# Patient Record
Sex: Male | Born: 1960 | Race: White | Hispanic: No | Marital: Married | Smoking: Current every day smoker
Health system: Southern US, Community
[De-identification: ages and names within clinical notes are randomized; demographics above are authoritative.]

## PROBLEM LIST (undated history)

## (undated) DIAGNOSIS — R55 Syncope and collapse: Secondary | ICD-10-CM

## (undated) HISTORY — DX: Syncope and collapse: R55

---

## 2004-09-13 ENCOUNTER — Encounter: Admission: RE | Admit: 2004-09-13 | Discharge: 2004-09-13 | Payer: Self-pay | Admitting: Internal Medicine

## 2012-03-06 ENCOUNTER — Other Ambulatory Visit: Payer: Self-pay | Admitting: Internal Medicine

## 2012-03-06 DIAGNOSIS — R55 Syncope and collapse: Secondary | ICD-10-CM

## 2012-03-07 ENCOUNTER — Ambulatory Visit
Admission: RE | Admit: 2012-03-07 | Discharge: 2012-03-07 | Disposition: A | Discharge status: Home / Self Care | Payer: BC Managed Care – PPO | Source: Ambulatory Visit | Attending: Internal Medicine | Admitting: Internal Medicine

## 2012-03-07 DIAGNOSIS — R55 Syncope and collapse: Secondary | ICD-10-CM

## 2012-04-04 ENCOUNTER — Other Ambulatory Visit (HOSPITAL_COMMUNITY): Payer: Self-pay | Admitting: Cardiovascular Disease

## 2012-04-05 ENCOUNTER — Other Ambulatory Visit (HOSPITAL_COMMUNITY): Payer: Self-pay | Admitting: Cardiovascular Disease

## 2012-04-05 DIAGNOSIS — R011 Cardiac murmur, unspecified: Secondary | ICD-10-CM

## 2012-04-05 DIAGNOSIS — R079 Chest pain, unspecified: Secondary | ICD-10-CM

## 2012-04-05 DIAGNOSIS — R55 Syncope and collapse: Secondary | ICD-10-CM

## 2012-04-16 ENCOUNTER — Ambulatory Visit (HOSPITAL_COMMUNITY)
Admission: RE | Admit: 2012-04-16 | Discharge: 2012-04-16 | Disposition: A | Discharge status: Home / Self Care | Payer: BC Managed Care – PPO | Source: Ambulatory Visit | Attending: Cardiovascular Disease | Admitting: Cardiovascular Disease

## 2012-04-16 DIAGNOSIS — I079 Rheumatic tricuspid valve disease, unspecified: Secondary | ICD-10-CM | POA: Insufficient documentation

## 2012-04-16 DIAGNOSIS — R55 Syncope and collapse: Secondary | ICD-10-CM | POA: Insufficient documentation

## 2012-04-16 DIAGNOSIS — R011 Cardiac murmur, unspecified: Secondary | ICD-10-CM | POA: Insufficient documentation

## 2012-04-16 DIAGNOSIS — F17200 Nicotine dependence, unspecified, uncomplicated: Secondary | ICD-10-CM | POA: Insufficient documentation

## 2012-04-16 NOTE — Progress Notes (Signed)
[facility]   2D echo completed 04/16/2012.   Cindy Kasee Hantz, RDCS   

## 2012-04-18 ENCOUNTER — Ambulatory Visit (HOSPITAL_COMMUNITY)
Admission: RE | Admit: 2012-04-18 | Discharge: 2012-04-18 | Disposition: A | Discharge status: Home / Self Care | Payer: BC Managed Care – PPO | Source: Ambulatory Visit | Attending: Cardiovascular Disease | Admitting: Cardiovascular Disease

## 2012-04-18 DIAGNOSIS — J449 Chronic obstructive pulmonary disease, unspecified: Secondary | ICD-10-CM | POA: Insufficient documentation

## 2012-04-18 DIAGNOSIS — R079 Chest pain, unspecified: Secondary | ICD-10-CM | POA: Insufficient documentation

## 2012-04-18 DIAGNOSIS — R55 Syncope and collapse: Secondary | ICD-10-CM | POA: Insufficient documentation

## 2012-04-18 DIAGNOSIS — J4489 Other specified chronic obstructive pulmonary disease: Secondary | ICD-10-CM | POA: Insufficient documentation

## 2012-04-18 DIAGNOSIS — Z8249 Family history of ischemic heart disease and other diseases of the circulatory system: Secondary | ICD-10-CM | POA: Insufficient documentation

## 2012-04-18 DIAGNOSIS — F17200 Nicotine dependence, unspecified, uncomplicated: Secondary | ICD-10-CM | POA: Insufficient documentation

## 2012-04-18 MED ORDER — TECHNETIUM TC 99M SESTAMIBI GENERIC - CARDIOLITE
10.4000 | Freq: Once | INTRAVENOUS | Status: AC | PRN
  Administered 2012-04-18: 10 via INTRAVENOUS

## 2012-04-18 MED ORDER — TECHNETIUM TC 99M SESTAMIBI GENERIC - CARDIOLITE
31.2000 | Freq: Once | INTRAVENOUS | Status: AC | PRN
  Administered 2012-04-18: 31.2 via INTRAVENOUS

## 2012-04-18 NOTE — Procedures (Addendum)
[facility] [address] [place] [address] [phone]  Cardiology Nuclear Med Study  Jared Hansen is a 51 y.o. male     MRN : [id number]     DOB: 09/01/1960  Procedure Date: 04/18/2012  Nuclear Med Background Indication for Stress Test:  Evaluation for Ischemia History:  COPD and No prior cardiac history Cardiac Risk Factors: Family History - CAD and Smoker  Symptoms:  Syncope   Nuclear Pre-Procedure Caffeine/Decaff Intake:  10:00pm NPO After: 7:30am   IV Site: R Antecubital  IV 0.9% NS with Angio Cath:  22g  Chest Size (in):  42 IV Started by: [name], CNMT  Height: 5\' 8"  (1.727 m)  Cup Size: n/a  BMI:  Body mass index is 30.26 kg/(m^2). Weight:  199 lb (90.266 kg)   Tech Comments:  n/a    Nuclear Med Study 1 or 2 day study: 1 day  Stress Test Type:  Stress  Order Authorizing Provider:  Hazeline Junker   Resting Radionuclide: Technetium 36m Sestamibi  Resting Radionuclide Dose: 10.4 mCi   Stress Radionuclide:  Technetium 27m Sestamibi  Stress Radionuclide Dose: 31.2 mCi           Stress Protocol Rest HR: 76 Stress HR:  173  Rest BP: 121/84 Stress BP: 183/78  Exercise Time (min): 08:30 METS: 10.1   Predicted Max HR: 169 bpm % Max HR: 102.37 bpm Rate Pressure Product: 21308   Dose of Adenosine (mg):  n/a Dose of Lexiscan: n/a mg  Dose of Atropine (mg): n/a Dose of Dobutamine: n/a mcg/kg/min (at max HR)  Stress Test Technologist: Esperanza Sheets, CCT Nuclear Technologist: Gonzella Lex, CNMT   Rest Procedure:  Myocardial perfusion imaging was performed at rest 45 minutes following the intravenous administration of Technetium 61m Sestamibi. Stress Procedure:  The patient performed treadmill exercise using a Bruce  Protocol for 8:30 minutes. The patient stopped due to SOB and denied any chest pain.  There were no significant ST-T wave changes.  Technetium 24m Sestamibi was injected at peak  exercise and myocardial perfusion imaging was performed after a brief delay.  Transient Ischemic Dilatation (Normal <1.22):  0.59 Lung/Heart Ratio (Normal <0.45):  0.32 QGS EDV:  71 ml QGS ESV:  20 ml LV Ejection Fraction: 72%  Rest ECG: NSR - Normal EKG  Stress ECG: Sinus tachycardia, no ischemic ST/T changes noted with exercise  QPS Raw Data Images:  Mild diaphragmatic attenuation.  Normal left ventricular size. Stress Images:  Mild inferior and septal defect. Rest Images:  Mild inferior defect with bowel attenuation Subtraction (SDS):  3  Impression Exercise Capacity:  Good exercise capacity. BP Response:  Normal blood pressure response. Clinical Symptoms:  Mild chest pain/dyspnea. ECG Impression:  No significant ST segment change suggestive of ischemia. Comparison with Prior Nuclear Study: No previous nuclear study performed  Overall Impression:  Low risk stress nuclear study. and Abnormal stress nuclear study. There is no significant reversible ischemia.  LV Wall Motion:  NL LV Function; NL Wall Motion  Chrystie Nose, MD, [facility] Attending Cardiologist [facility]  Chrystie Nose, MD  04/18/2012 5:23 PM

## 2012-09-05 ENCOUNTER — Encounter: Payer: Self-pay | Admitting: Cardiovascular Disease

## 2014-12-31 ENCOUNTER — Encounter: Payer: Self-pay | Admitting: *Deleted

## 2015-01-21 ENCOUNTER — Encounter: Payer: Self-pay | Admitting: Cardiovascular Disease

## 2016-03-13 DIAGNOSIS — M47816 Spondylosis without myelopathy or radiculopathy, lumbar region: Secondary | ICD-10-CM | POA: Diagnosis not present

## 2016-03-13 DIAGNOSIS — M9903 Segmental and somatic dysfunction of lumbar region: Secondary | ICD-10-CM | POA: Diagnosis not present

## 2016-06-28 DIAGNOSIS — L570 Actinic keratosis: Secondary | ICD-10-CM | POA: Diagnosis not present

## 2016-06-28 DIAGNOSIS — Z85828 Personal history of other malignant neoplasm of skin: Secondary | ICD-10-CM | POA: Diagnosis not present

## 2016-06-28 DIAGNOSIS — L821 Other seborrheic keratosis: Secondary | ICD-10-CM | POA: Diagnosis not present

## 2016-08-25 DIAGNOSIS — Z0000 Encounter for general adult medical examination without abnormal findings: Secondary | ICD-10-CM | POA: Diagnosis not present

## 2016-08-25 DIAGNOSIS — Z125 Encounter for screening for malignant neoplasm of prostate: Secondary | ICD-10-CM | POA: Diagnosis not present

## 2016-08-30 DIAGNOSIS — E7800 Pure hypercholesterolemia, unspecified: Secondary | ICD-10-CM | POA: Diagnosis not present

## 2016-08-30 DIAGNOSIS — M722 Plantar fascial fibromatosis: Secondary | ICD-10-CM | POA: Diagnosis not present

## 2016-08-30 DIAGNOSIS — M545 Low back pain: Secondary | ICD-10-CM | POA: Diagnosis not present

## 2016-08-30 DIAGNOSIS — Z0001 Encounter for general adult medical examination with abnormal findings: Secondary | ICD-10-CM | POA: Diagnosis not present

## 2016-08-30 DIAGNOSIS — Z1212 Encounter for screening for malignant neoplasm of rectum: Secondary | ICD-10-CM | POA: Diagnosis not present

## 2016-09-07 ENCOUNTER — Other Ambulatory Visit: Payer: Self-pay | Admitting: Acute Care

## 2016-09-07 DIAGNOSIS — F17210 Nicotine dependence, cigarettes, uncomplicated: Secondary | ICD-10-CM

## 2016-09-14 ENCOUNTER — Telehealth: Payer: Self-pay | Admitting: Acute Care

## 2016-09-14 NOTE — Telephone Encounter (Signed)
I called Arena at [facility] to let her know I had actually cancelled this appointment for the CT as well by mistake and that we would be calling to reschedule this once the patient's appointment is rescheduled.

## 2016-09-18 ENCOUNTER — Ambulatory Visit: Payer: Self-pay

## 2016-09-18 ENCOUNTER — Encounter: Payer: Self-pay | Admitting: Acute Care

## 2016-09-18 NOTE — Telephone Encounter (Signed)
LMTC x 1  

## 2016-09-26 NOTE — Telephone Encounter (Signed)
Spoke with pt.  Per [employer] he will need to contact Dr Carolee RotaPharr's office to have a medical review to [facility]  Pt will call back once he gets a response.  Will await call

## 2016-10-10 NOTE — Telephone Encounter (Signed)
Will route this call back to the lung nodule pool to follow up on SDMV.

## 2016-10-12 NOTE — Telephone Encounter (Signed)
LMTC x 1  

## 2016-10-17 NOTE — Telephone Encounter (Signed)
Angelique BlonderDenise, have you heard from the patient?

## 2016-10-19 NOTE — Telephone Encounter (Signed)
Jared Hansen any updates on the message?  thanks

## 2016-10-19 NOTE — Telephone Encounter (Signed)
Spoke with pt.  He states that he talked to Dr Carolee RotaPharr's office yesterday and they are waiting to hear from [facility] regarding medical release of info for lung screening review.  I informed him of the option of doing scan [facility] for an out of pocket charge.  Pt wants to wait and see what he can find out through Dr Carolee RotaPharr's office. I advised pt that I will call him back in a week to see if there has been any progress.  I will close this message and refer to referral notes.

## 2016-10-23 DIAGNOSIS — R197 Diarrhea, unspecified: Secondary | ICD-10-CM | POA: Diagnosis not present

## 2016-10-23 DIAGNOSIS — Z79899 Other long term (current) drug therapy: Secondary | ICD-10-CM | POA: Diagnosis not present

## 2016-10-24 ENCOUNTER — Telehealth: Payer: Self-pay | Admitting: Acute Care

## 2016-10-25 NOTE — Telephone Encounter (Signed)
Will route to Lung Nodule Pool

## 2016-10-26 NOTE — Telephone Encounter (Signed)
Spoke with Jared Hansen at Dr Carolee RotaPharr's office and she states that they have not heard anything from [facility] regarding ling screening. I spoke with Jared Hansen and made him aware that Dr Carolee RotaPharr's office has not heard from [facility].  I explained to him again that [employer] does not cover the lung screening and that it would be [facility] and [facility].  Jared Hansen asked that I call him back in a week so he can think about it.  Will defer until that time.  Will close this message and refer to referral notes.

## 2017-01-16 ENCOUNTER — Telehealth: Payer: Self-pay | Admitting: Acute Care

## 2017-01-16 NOTE — Telephone Encounter (Signed)
LMTC x 1  

## 2017-01-16 NOTE — Telephone Encounter (Signed)
Pat with Dr. Carolee RotaPharr's office returned call, CB [phone].

## 2017-01-16 NOTE — Telephone Encounter (Signed)
Will forward to the lung nodule pool 

## 2017-01-16 NOTE — Telephone Encounter (Signed)
Jared Hansen is faxing referral over.

## 2017-01-19 NOTE — Telephone Encounter (Signed)
Referral received.  Will close this message.

## 2017-03-19 DIAGNOSIS — M9903 Segmental and somatic dysfunction of lumbar region: Secondary | ICD-10-CM | POA: Diagnosis not present

## 2017-03-19 DIAGNOSIS — M5442 Lumbago with sciatica, left side: Secondary | ICD-10-CM | POA: Diagnosis not present

## 2017-03-20 DIAGNOSIS — M5442 Lumbago with sciatica, left side: Secondary | ICD-10-CM | POA: Diagnosis not present

## 2017-03-20 DIAGNOSIS — M9903 Segmental and somatic dysfunction of lumbar region: Secondary | ICD-10-CM | POA: Diagnosis not present

## 2017-03-21 DIAGNOSIS — M9903 Segmental and somatic dysfunction of lumbar region: Secondary | ICD-10-CM | POA: Diagnosis not present

## 2017-03-21 DIAGNOSIS — M5442 Lumbago with sciatica, left side: Secondary | ICD-10-CM | POA: Diagnosis not present

## 2017-03-26 DIAGNOSIS — M9903 Segmental and somatic dysfunction of lumbar region: Secondary | ICD-10-CM | POA: Diagnosis not present

## 2017-03-26 DIAGNOSIS — M5442 Lumbago with sciatica, left side: Secondary | ICD-10-CM | POA: Diagnosis not present

## 2017-03-27 DIAGNOSIS — M9903 Segmental and somatic dysfunction of lumbar region: Secondary | ICD-10-CM | POA: Diagnosis not present

## 2017-03-27 DIAGNOSIS — M5442 Lumbago with sciatica, left side: Secondary | ICD-10-CM | POA: Diagnosis not present

## 2017-03-28 DIAGNOSIS — M9903 Segmental and somatic dysfunction of lumbar region: Secondary | ICD-10-CM | POA: Diagnosis not present

## 2017-03-28 DIAGNOSIS — M5442 Lumbago with sciatica, left side: Secondary | ICD-10-CM | POA: Diagnosis not present

## 2017-03-29 DIAGNOSIS — M9903 Segmental and somatic dysfunction of lumbar region: Secondary | ICD-10-CM | POA: Diagnosis not present

## 2017-03-29 DIAGNOSIS — M5442 Lumbago with sciatica, left side: Secondary | ICD-10-CM | POA: Diagnosis not present

## 2017-04-09 DIAGNOSIS — M5442 Lumbago with sciatica, left side: Secondary | ICD-10-CM | POA: Diagnosis not present

## 2017-04-09 DIAGNOSIS — M9903 Segmental and somatic dysfunction of lumbar region: Secondary | ICD-10-CM | POA: Diagnosis not present

## 2017-04-11 DIAGNOSIS — M5442 Lumbago with sciatica, left side: Secondary | ICD-10-CM | POA: Diagnosis not present

## 2017-04-11 DIAGNOSIS — M9903 Segmental and somatic dysfunction of lumbar region: Secondary | ICD-10-CM | POA: Diagnosis not present

## 2017-04-16 DIAGNOSIS — M5442 Lumbago with sciatica, left side: Secondary | ICD-10-CM | POA: Diagnosis not present

## 2017-04-16 DIAGNOSIS — M9903 Segmental and somatic dysfunction of lumbar region: Secondary | ICD-10-CM | POA: Diagnosis not present

## 2017-04-18 DIAGNOSIS — M5442 Lumbago with sciatica, left side: Secondary | ICD-10-CM | POA: Diagnosis not present

## 2017-04-18 DIAGNOSIS — M9903 Segmental and somatic dysfunction of lumbar region: Secondary | ICD-10-CM | POA: Diagnosis not present

## 2017-04-25 DIAGNOSIS — M9903 Segmental and somatic dysfunction of lumbar region: Secondary | ICD-10-CM | POA: Diagnosis not present

## 2017-04-25 DIAGNOSIS — M5442 Lumbago with sciatica, left side: Secondary | ICD-10-CM | POA: Diagnosis not present

## 2017-04-30 DIAGNOSIS — M5442 Lumbago with sciatica, left side: Secondary | ICD-10-CM | POA: Diagnosis not present

## 2017-04-30 DIAGNOSIS — M9903 Segmental and somatic dysfunction of lumbar region: Secondary | ICD-10-CM | POA: Diagnosis not present

## 2017-05-07 DIAGNOSIS — S0180XA Unspecified open wound of other part of head, initial encounter: Secondary | ICD-10-CM | POA: Diagnosis not present

## 2017-06-28 DIAGNOSIS — L821 Other seborrheic keratosis: Secondary | ICD-10-CM | POA: Diagnosis not present

## 2017-06-28 DIAGNOSIS — Z85828 Personal history of other malignant neoplasm of skin: Secondary | ICD-10-CM | POA: Diagnosis not present

## 2017-06-28 DIAGNOSIS — L570 Actinic keratosis: Secondary | ICD-10-CM | POA: Diagnosis not present

## 2017-09-05 DIAGNOSIS — Z125 Encounter for screening for malignant neoplasm of prostate: Secondary | ICD-10-CM | POA: Diagnosis not present

## 2017-09-05 DIAGNOSIS — E7800 Pure hypercholesterolemia, unspecified: Secondary | ICD-10-CM | POA: Diagnosis not present

## 2017-09-05 DIAGNOSIS — Z0000 Encounter for general adult medical examination without abnormal findings: Secondary | ICD-10-CM | POA: Diagnosis not present

## 2017-09-11 DIAGNOSIS — Z0001 Encounter for general adult medical examination with abnormal findings: Secondary | ICD-10-CM | POA: Diagnosis not present

## 2017-09-11 DIAGNOSIS — M722 Plantar fascial fibromatosis: Secondary | ICD-10-CM | POA: Diagnosis not present

## 2017-09-11 DIAGNOSIS — E7800 Pure hypercholesterolemia, unspecified: Secondary | ICD-10-CM | POA: Diagnosis not present

## 2017-09-11 DIAGNOSIS — M545 Low back pain: Secondary | ICD-10-CM | POA: Diagnosis not present

## 2018-05-15 HISTORY — PX: REVISION TOTAL HIP ARTHROPLASTY: SHX766

## 2018-07-03 DIAGNOSIS — Z85828 Personal history of other malignant neoplasm of skin: Secondary | ICD-10-CM | POA: Diagnosis not present

## 2018-07-03 DIAGNOSIS — L565 Disseminated superficial actinic porokeratosis (DSAP): Secondary | ICD-10-CM | POA: Diagnosis not present

## 2018-07-03 DIAGNOSIS — L821 Other seborrheic keratosis: Secondary | ICD-10-CM | POA: Diagnosis not present

## 2018-07-03 DIAGNOSIS — L570 Actinic keratosis: Secondary | ICD-10-CM | POA: Diagnosis not present

## 2018-12-23 DIAGNOSIS — M9903 Segmental and somatic dysfunction of lumbar region: Secondary | ICD-10-CM | POA: Diagnosis not present

## 2018-12-23 DIAGNOSIS — M5441 Lumbago with sciatica, right side: Secondary | ICD-10-CM | POA: Diagnosis not present

## 2018-12-25 DIAGNOSIS — M5441 Lumbago with sciatica, right side: Secondary | ICD-10-CM | POA: Diagnosis not present

## 2018-12-25 DIAGNOSIS — M9903 Segmental and somatic dysfunction of lumbar region: Secondary | ICD-10-CM | POA: Diagnosis not present

## 2018-12-30 DIAGNOSIS — M5441 Lumbago with sciatica, right side: Secondary | ICD-10-CM | POA: Diagnosis not present

## 2018-12-30 DIAGNOSIS — M9903 Segmental and somatic dysfunction of lumbar region: Secondary | ICD-10-CM | POA: Diagnosis not present

## 2018-12-31 DIAGNOSIS — M9903 Segmental and somatic dysfunction of lumbar region: Secondary | ICD-10-CM | POA: Diagnosis not present

## 2018-12-31 DIAGNOSIS — M5441 Lumbago with sciatica, right side: Secondary | ICD-10-CM | POA: Diagnosis not present

## 2019-01-01 DIAGNOSIS — M9903 Segmental and somatic dysfunction of lumbar region: Secondary | ICD-10-CM | POA: Diagnosis not present

## 2019-01-01 DIAGNOSIS — M5441 Lumbago with sciatica, right side: Secondary | ICD-10-CM | POA: Diagnosis not present

## 2019-01-13 DIAGNOSIS — M5441 Lumbago with sciatica, right side: Secondary | ICD-10-CM | POA: Diagnosis not present

## 2019-01-13 DIAGNOSIS — M9903 Segmental and somatic dysfunction of lumbar region: Secondary | ICD-10-CM | POA: Diagnosis not present

## 2019-01-21 ENCOUNTER — Other Ambulatory Visit: Payer: Self-pay | Admitting: Chiropractic Medicine

## 2019-01-21 DIAGNOSIS — M9903 Segmental and somatic dysfunction of lumbar region: Secondary | ICD-10-CM | POA: Diagnosis not present

## 2019-01-21 DIAGNOSIS — M545 Low back pain, unspecified: Secondary | ICD-10-CM

## 2019-01-21 DIAGNOSIS — G8929 Other chronic pain: Secondary | ICD-10-CM

## 2019-01-21 DIAGNOSIS — M5441 Lumbago with sciatica, right side: Secondary | ICD-10-CM | POA: Diagnosis not present

## 2019-01-22 DIAGNOSIS — M5441 Lumbago with sciatica, right side: Secondary | ICD-10-CM | POA: Diagnosis not present

## 2019-01-22 DIAGNOSIS — M9903 Segmental and somatic dysfunction of lumbar region: Secondary | ICD-10-CM | POA: Diagnosis not present

## 2019-01-27 DIAGNOSIS — M9903 Segmental and somatic dysfunction of lumbar region: Secondary | ICD-10-CM | POA: Diagnosis not present

## 2019-01-27 DIAGNOSIS — M5441 Lumbago with sciatica, right side: Secondary | ICD-10-CM | POA: Diagnosis not present

## 2019-01-28 DIAGNOSIS — M7138 Other bursal cyst, other site: Secondary | ICD-10-CM | POA: Diagnosis not present

## 2019-01-28 DIAGNOSIS — M4316 Spondylolisthesis, lumbar region: Secondary | ICD-10-CM | POA: Diagnosis not present

## 2019-01-28 DIAGNOSIS — M47816 Spondylosis without myelopathy or radiculopathy, lumbar region: Secondary | ICD-10-CM | POA: Diagnosis not present

## 2019-01-28 DIAGNOSIS — M5126 Other intervertebral disc displacement, lumbar region: Secondary | ICD-10-CM | POA: Diagnosis not present

## 2019-01-29 DIAGNOSIS — M9903 Segmental and somatic dysfunction of lumbar region: Secondary | ICD-10-CM | POA: Diagnosis not present

## 2019-01-29 DIAGNOSIS — M5441 Lumbago with sciatica, right side: Secondary | ICD-10-CM | POA: Diagnosis not present

## 2019-02-03 DIAGNOSIS — M5441 Lumbago with sciatica, right side: Secondary | ICD-10-CM | POA: Diagnosis not present

## 2019-02-03 DIAGNOSIS — M9903 Segmental and somatic dysfunction of lumbar region: Secondary | ICD-10-CM | POA: Diagnosis not present

## 2019-02-05 DIAGNOSIS — M5441 Lumbago with sciatica, right side: Secondary | ICD-10-CM | POA: Diagnosis not present

## 2019-02-05 DIAGNOSIS — M9903 Segmental and somatic dysfunction of lumbar region: Secondary | ICD-10-CM | POA: Diagnosis not present

## 2019-02-10 DIAGNOSIS — M5441 Lumbago with sciatica, right side: Secondary | ICD-10-CM | POA: Diagnosis not present

## 2019-02-10 DIAGNOSIS — M9903 Segmental and somatic dysfunction of lumbar region: Secondary | ICD-10-CM | POA: Diagnosis not present

## 2019-02-14 ENCOUNTER — Other Ambulatory Visit: Payer: Self-pay

## 2019-02-21 DIAGNOSIS — M25551 Pain in right hip: Secondary | ICD-10-CM | POA: Diagnosis not present

## 2019-03-04 ENCOUNTER — Encounter: Payer: Self-pay | Admitting: Cardiovascular Disease

## 2019-03-04 DIAGNOSIS — Z125 Encounter for screening for malignant neoplasm of prostate: Secondary | ICD-10-CM | POA: Diagnosis not present

## 2019-03-04 DIAGNOSIS — Z0001 Encounter for general adult medical examination with abnormal findings: Secondary | ICD-10-CM | POA: Diagnosis not present

## 2019-03-04 DIAGNOSIS — Z1159 Encounter for screening for other viral diseases: Secondary | ICD-10-CM | POA: Diagnosis not present

## 2019-03-07 DIAGNOSIS — M1611 Unilateral primary osteoarthritis, right hip: Secondary | ICD-10-CM | POA: Diagnosis not present

## 2019-03-07 DIAGNOSIS — M25551 Pain in right hip: Secondary | ICD-10-CM | POA: Diagnosis not present

## 2019-03-10 DIAGNOSIS — E7800 Pure hypercholesterolemia, unspecified: Secondary | ICD-10-CM | POA: Diagnosis not present

## 2019-03-10 DIAGNOSIS — Z0000 Encounter for general adult medical examination without abnormal findings: Secondary | ICD-10-CM | POA: Diagnosis not present

## 2019-03-10 DIAGNOSIS — Z23 Encounter for immunization: Secondary | ICD-10-CM | POA: Diagnosis not present

## 2019-03-10 DIAGNOSIS — Z1212 Encounter for screening for malignant neoplasm of rectum: Secondary | ICD-10-CM | POA: Diagnosis not present

## 2019-03-10 DIAGNOSIS — L719 Rosacea, unspecified: Secondary | ICD-10-CM | POA: Diagnosis not present

## 2019-03-10 DIAGNOSIS — J309 Allergic rhinitis, unspecified: Secondary | ICD-10-CM | POA: Diagnosis not present

## 2019-04-01 DIAGNOSIS — Z01818 Encounter for other preprocedural examination: Secondary | ICD-10-CM | POA: Diagnosis not present

## 2019-04-01 DIAGNOSIS — M1611 Unilateral primary osteoarthritis, right hip: Secondary | ICD-10-CM | POA: Diagnosis not present

## 2019-04-24 DIAGNOSIS — M1611 Unilateral primary osteoarthritis, right hip: Secondary | ICD-10-CM | POA: Diagnosis not present

## 2019-05-12 DIAGNOSIS — M1611 Unilateral primary osteoarthritis, right hip: Secondary | ICD-10-CM | POA: Diagnosis not present

## 2019-06-25 DIAGNOSIS — Z96641 Presence of right artificial hip joint: Secondary | ICD-10-CM | POA: Diagnosis not present

## 2019-06-25 DIAGNOSIS — Z471 Aftercare following joint replacement surgery: Secondary | ICD-10-CM | POA: Diagnosis not present

## 2019-07-14 DIAGNOSIS — Z85828 Personal history of other malignant neoplasm of skin: Secondary | ICD-10-CM | POA: Diagnosis not present

## 2019-07-14 DIAGNOSIS — L570 Actinic keratosis: Secondary | ICD-10-CM | POA: Diagnosis not present

## 2019-07-14 DIAGNOSIS — D225 Melanocytic nevi of trunk: Secondary | ICD-10-CM | POA: Diagnosis not present

## 2019-07-14 DIAGNOSIS — L821 Other seborrheic keratosis: Secondary | ICD-10-CM | POA: Diagnosis not present

## 2019-07-16 DIAGNOSIS — Z23 Encounter for immunization: Secondary | ICD-10-CM | POA: Diagnosis not present

## 2019-07-31 ENCOUNTER — Other Ambulatory Visit: Payer: Self-pay | Admitting: *Deleted

## 2019-07-31 DIAGNOSIS — F17210 Nicotine dependence, cigarettes, uncomplicated: Secondary | ICD-10-CM

## 2019-08-28 ENCOUNTER — Ambulatory Visit: Payer: BC Managed Care – PPO | Attending: Family

## 2019-08-28 DIAGNOSIS — Z23 Encounter for immunization: Secondary | ICD-10-CM

## 2019-08-28 NOTE — Progress Notes (Signed)
   Covid-19 Vaccination Clinic  Name:  Jared Hansen    MRN: [id number] DOB: [birth year]  08/28/2019  [name] was observed post Covid-19 immunization for 15 minutes without incident. He was provided with Vaccine Information Sheet and instruction to access the V-Safe system.   [name] was instructed to call 911 with any severe reactions post vaccine: Marland Kitchen Difficulty breathing  . Swelling of face and throat  . A fast heartbeat  . A bad rash all over body  . Dizziness and weakness   Immunizations Administered    Name Date Dose VIS Date Route   Moderna COVID-19 Vaccine 08/28/2019  1:00 PM 0.5 mL 04/15/2019 Intramuscular   Manufacturer: Moderna   Lot: [id number]   NDC: 67519-824-29

## 2019-08-29 ENCOUNTER — Other Ambulatory Visit: Payer: Self-pay

## 2019-08-29 ENCOUNTER — Ambulatory Visit
Admission: RE | Admit: 2019-08-29 | Discharge: 2019-08-29 | Disposition: A | Discharge status: Home / Self Care | Payer: BC Managed Care – PPO | Source: Ambulatory Visit | Attending: Acute Care | Admitting: Acute Care

## 2019-08-29 ENCOUNTER — Encounter: Payer: Self-pay | Admitting: Primary Care

## 2019-08-29 ENCOUNTER — Ambulatory Visit (INDEPENDENT_AMBULATORY_CARE_PROVIDER_SITE_OTHER): Payer: BC Managed Care – PPO | Admitting: Primary Care

## 2019-08-29 VITALS — BP 118/80 | HR 72 | Temp 97.2°F | Ht 68.0 in | Wt 187.2 lb

## 2019-08-29 DIAGNOSIS — F17200 Nicotine dependence, unspecified, uncomplicated: Secondary | ICD-10-CM

## 2019-08-29 DIAGNOSIS — F17210 Nicotine dependence, cigarettes, uncomplicated: Secondary | ICD-10-CM

## 2019-08-29 NOTE — Patient Instructions (Signed)
Declined AVS

## 2019-08-29 NOTE — Progress Notes (Signed)
Shared Decision Making Visit Lung Cancer Screening Program [phone])   Eligibility:  Age 59 y.o.  Pack Years Smoking History Calculation 30 (# packs/per year x # years smoked)  Recent History of coughing up blood  no  Unexplained weight loss? no ( >Than 15 pounds within the last 6 months )  Prior History Lung / other cancer no (Diagnosis within the last 5 years already requiring surveillance chest CT Scans). Smoking Status Current Smoker   Visit Components:  Discussion included one or more decision making aids. yes  Discussion included risk/benefits of screening. yes  Discussion included potential follow up diagnostic testing for abnormal scans. yes  Discussion included meaning and risk of over diagnosis. yes  Discussion included meaning and risk of False Positives. yes  Discussion included meaning of total radiation exposure. yes  Counseling Included:  Importance of adherence to annual lung cancer LDCT screening. yes  Impact of comorbidities on ability to participate in the program. yes  Ability and willingness to under diagnostic treatment. yes  Smoking Cessation Counseling:  Current Smokers:   Discussed importance of smoking cessation. yes  Information about tobacco cessation classes and interventions provided to patient. yes  Patient provided with "ticket" for LDCT Scan. yes  Symptomatic Patient. no  Counseling(Intermediate counseling: > three minutes) 99406  Diagnosis Code: Tobacco Use Z72.0  Asymptomatic Patient yes  Counseling (Intermediate counseling: > three minutes counseling) N6295  Written Order for Lung Cancer Screening with LDCT placed in Epic. Yes (CT Chest Lung Cancer Screening Low Dose W/O CM) [id number] Z12.2-Screening of respiratory organs Z87.891-Personal history of nicotine dependence  BP 118/80 (BP Location: Left Arm, Patient Position: Sitting, Cuff Size: Normal)   Pulse 72   Temp (!) 97.2 F (36.2 C) (Temporal)   Ht 5\' 8"  (1.727  m)   Wt 187 lb 3.2 oz (84.9 kg)   SpO2 99%   BMI 28.46 kg/m   I have spent 18 minutes of face to face time with Mr. Jared Hansen discussing the risks and benefits of lung cancer screening. We viewed a power point together that explained in detail the above noted topics. We paused at intervals to allow for questions to be asked and answered to ensure understanding.We discussed that the single most powerful action that  can take to decrease his risk of developing lung cancer is to quit smoking. We discussed whether or not he is ready to commit to setting a quit date. We discussed the time and location of the scan, and that either Doroteo Glassman RN or I will call with the results within 24-48 hours of receiving them. I have offered him  a copy of the power point we viewed  as a resource in the event they need reinforcement of the concepts we discussed today in the office. The patient verbalized understanding of all of  the above and had no further questions upon leaving the office. They have my contact information in the event they have any further questions.  I spent 3 minutes counseling on smoking cessation and the health risks of continued tobacco abuse.  I was unable to explained to the patient has been a high incidence of coronary artery disease noted on these exams. This patient is not on statin therapy.  He should follow-up with their PCP regarding any incidental finding of coronary artery disease and management with diet or medication as their PCP  feels is clinically indicated.    Patient is not ready to quit smoking. Patient is unsure if he is  willing to commit to  annual screening but is agreeing to have LDCT today. He had not questions after  reviewing power point discussion and did not want a copy to review. Declined AVS be  printed, he did  not want a copy of power point.    Glenford Bayley, NP

## 2019-09-02 ENCOUNTER — Other Ambulatory Visit: Payer: Self-pay | Admitting: *Deleted

## 2019-09-02 DIAGNOSIS — F17210 Nicotine dependence, cigarettes, uncomplicated: Secondary | ICD-10-CM

## 2019-09-02 NOTE — Progress Notes (Signed)
Please call patient and let them  know their  low dose Ct was read as a Lung RADS 1, negative study: no nodules or definitely benign nodules. Radiology recommendation is for a repeat LDCT in 12 months. .Please let them  know we will order and schedule their  annual screening scan for 08/2020. Please let them  know there was notation of CAD on their  scan.  Please remind the patient  that this is a non-gated exam therefore degree or severity of disease  cannot be determined. Please have them  follow up with their PCP regarding potential risk factor modification, dietary therapy or pharmacologic therapy if clinically indicated. Pt.  is  not currently on statin therapy. Please place order for annual  screening scan for  08/2020 and fax results to PCP. Thanks so much.  Jared Hansen, please encourage him to ask his PCP about statin therapy. Thanks so much

## 2019-09-15 DIAGNOSIS — L820 Inflamed seborrheic keratosis: Secondary | ICD-10-CM | POA: Diagnosis not present

## 2019-09-17 DIAGNOSIS — I2510 Atherosclerotic heart disease of native coronary artery without angina pectoris: Secondary | ICD-10-CM | POA: Diagnosis not present

## 2019-09-24 ENCOUNTER — Ambulatory Visit (INDEPENDENT_AMBULATORY_CARE_PROVIDER_SITE_OTHER): Payer: BC Managed Care – PPO | Admitting: Cardiovascular Disease

## 2019-09-24 ENCOUNTER — Encounter: Payer: Self-pay | Admitting: Cardiovascular Disease

## 2019-09-24 ENCOUNTER — Other Ambulatory Visit: Payer: Self-pay

## 2019-09-24 VITALS — BP 116/78 | HR 76 | Ht 68.0 in | Wt 187.0 lb

## 2019-09-24 DIAGNOSIS — I2510 Atherosclerotic heart disease of native coronary artery without angina pectoris: Secondary | ICD-10-CM | POA: Diagnosis not present

## 2019-09-24 DIAGNOSIS — Z720 Tobacco use: Secondary | ICD-10-CM

## 2019-09-24 NOTE — Progress Notes (Signed)
09/24/2019 Jared Hansen   02-28-1961  [id number]  Primary Physician Jared Pretty, MD Primary Cardiologist: Jared Harp MD Jared Hansen, [place]  HPI:  Jared Hansen is a 59 y.o. thin and fit appearing married Caucasian male father 2 children referred by Jared Hansen for cardiovascular dilation because of coronary calcification seen on chest CT.  He previously worked at 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  His risk factors include 20-pack-year tobacco abuse currently smoking 1/2 pack/day.  He has no other cardiac risk factors.  Is no family history of heart disease.  Never had a heart attack or stroke.  He denies chest pain or shortness of breath.  Does walk 10-15,000 steps a day.  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  Screening chest CT performed/16/21 showed calcification in the LAD and circumflex territories.   Current Meds  Medication Sig  . acyclovir (ZOVIRAX) 800 MG tablet Take 800 mg by mouth as directed.   Marland Kitchen aspirin 81 MG chewable tablet Chew 81 mg by mouth daily.   . colchicine (COLCRYS) 0.6 MG tablet Colcrys 0.6 mg tablet  . Cyanocobalamin 3000 MCG CAPS Take 3,000 mcg by mouth daily.   . Multiple Vitamin (MULTIVITAMIN) tablet Take 1 tablet by mouth daily.  . nitroGLYCERIN (NITROSTAT) 0.4 MG SL tablet PLACE 1 TABLET UNDER THE TONGUE EVERY 5 MINUTES UP TO 3 TABLETS FOR CHEST PAIN. CALL 911 AT 15 MINUTES.     No Known Allergies  Social History   Socioeconomic History  . Marital status: Married    Spouse name: Not on file  . Number of children: Not on file  . Years of education: Not on file  . Highest education level: Not on file  Occupational History  . Not on file  Tobacco Use  . Smoking status: Current Every Day Smoker    Years: 42.00    Types: Cigarettes  . Smokeless tobacco: Never Used  . Tobacco comment: 1/2 per day    Substance and Sexual Activity  . Alcohol use: Not Currently  . Drug use: Not Currently  . Sexual activity: Not Currently  Other Topics Concern  . Not on file  Social History Narrative  . Not on file   Social Determinants of Health   Financial Resource Strain:   . Difficulty of Paying Living Expenses:   Food Insecurity:   . Worried About Charity fundraiser in the Last Year:   . Arboriculturist in the Last Year:   Transportation Needs:   . Film/video editor (Medical):   Marland Kitchen Lack of Transportation (Non-Medical):   Physical Activity:   . Days of Exercise per Week:   . Minutes of Exercise per Session:   Stress:   . Feeling of Stress :   Social Connections:   . Frequency of Communication with Friends and Family:   . Frequency of Social Gatherings with Friends and Family:   . Attends Religious Services:   . Active Member of Clubs or Organizations:   . Attends Archivist Meetings:   Marland Kitchen Marital Status:   Intimate Partner Violence:   . Fear of Current or Ex-Partner:   . Emotionally Abused:   Marland Kitchen Physically Abused:   . Sexually Abused:      Review of Systems: General: negative for chills, fever, night sweats or weight changes.  Cardiovascular: negative for chest pain, dyspnea on exertion, edema, orthopnea, palpitations, paroxysmal nocturnal dyspnea or shortness of breath Dermatological: negative for rash Respiratory: negative for cough or wheezing Urologic: negative for hematuria Abdominal: negative for nausea, vomiting, diarrhea, bright red blood per rectum, melena, or hematemesis Neurologic: negative for visual changes, syncope, or dizziness All other systems reviewed and are otherwise negative except as noted above.    Blood pressure 116/78, pulse 76, height 5\' 8"  (1.727 m), weight 187 lb (84.8 kg).  General appearance: alert and no distress Neck: no adenopathy, no carotid bruit, no JVD, supple, symmetrical, trachea midline and thyroid not enlarged, symmetric,  no tenderness/mass/nodules Lungs: clear to auscultation bilaterally Heart: regular rate and rhythm, S1, S2 normal, no murmur, click, rub or gallop Extremities: extremities normal, atraumatic, no cyanosis or edema Pulses: 2+ and symmetric Skin: Skin color, texture, turgor normal. No rashes or lesions Neurologic: Alert and oriented X 3, normal strength and tone. Normal symmetric reflexes. Normal coordination and gait  EKG normal sinus rhythm at 76 without ST or T wave changes.  I personally reviewed this EKG.  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  His cardiac risk factor profile is notable for ongoing tobacco abuse 20 pack years, half a pack per day for last 40 years.  Otherwise, he is not diabetic, hypertensive or hyperlipidemic.  There is no family history for heart disease.  Never had a heart attack or stroke.  He denies chest pain or shortness of breath.  He is fairly active.  He had negative Myoview stress test back in 2013.  I am going to get a coronary calcium score to further quantitate his coronary Calcification.  Tobacco abuse 20 pack years of tobacco abuse smoking 1/2 pack/day.      2014 MD FACP,FACC,FAHA, [facility] 09/24/2019 4:16 PM

## 2019-09-24 NOTE — Assessment & Plan Note (Signed)
20 pack years of tobacco abuse smoking 1/2 pack/day.

## 2019-09-24 NOTE — Addendum Note (Signed)
Addended by: Freddi Starr on: 09/24/2019 04:34 PM   Modules accepted: Orders

## 2019-09-24 NOTE — Assessment & Plan Note (Signed)
Jared Hansen was referred to me by Dr. Renne Crigler for evaluation of coronary calcification seen on screening chest CT performed 08/29/2019 in the LAD and circumflex territories.  His cardiac risk factor profile is notable for ongoing tobacco abuse 20 pack years, half a pack per day for last 40 years.  Otherwise, he is not diabetic, hypertensive or hyperlipidemic.  There is no family history for heart disease.  Never had a heart attack or stroke.  He denies chest pain or shortness of breath.  He is fairly active.  He had negative Myoview stress test back in 2013.  I am going to get a coronary calcium score to further quantitate his coronary Calcification.

## 2019-09-24 NOTE — Patient Instructions (Signed)
Medication Instructions:  NO CHANGE *If you need a refill on your cardiac medications before your next appointment, please call your pharmacy*   Lab Work: If you have labs (blood work) drawn today and your tests are completely normal, you will receive your results only by: Marland Kitchen MyChart Message (if you have MyChart) OR . A paper copy in the mail If you have any lab test that is abnormal or we need to change your treatment, we will call you to review the results.   Testing/Procedures:  CT CALCIUM SCORING AT [address]   Follow-Up: At [facility], you and your health needs are our priority.  As part of our continuing mission to provide you with exceptional heart care, we have created designated Provider Care Teams.  These Care Teams include your primary Cardiologist (physician) and Advanced Practice Providers (APPs -  Physician Assistants and Nurse Practitioners) who all work together to provide you with the care you need, when you need it.  We recommend signing up for the patient portal called "MyChart".  Sign up information is provided on this After Visit Summary.  MyChart is used to connect with patients for Virtual Visits (Telemedicine).  Patients are able to view lab/test results, encounter notes, upcoming appointments, etc.  Non-urgent messages can be sent to your provider as well.   To learn more about what you can do with MyChart, go to [email or web address].    Your next appointment:    A NEEDED

## 2019-09-29 ENCOUNTER — Other Ambulatory Visit: Payer: Self-pay

## 2019-09-29 ENCOUNTER — Inpatient Hospital Stay: Admission: RE | Admit: 2019-09-29 | Payer: BC Managed Care – PPO | Source: Ambulatory Visit

## 2019-09-29 ENCOUNTER — Ambulatory Visit (INDEPENDENT_AMBULATORY_CARE_PROVIDER_SITE_OTHER)
Admission: RE | Admit: 2019-09-29 | Discharge: 2019-09-29 | Disposition: A | Discharge status: Home / Self Care | Payer: Self-pay | Source: Ambulatory Visit | Attending: Cardiovascular Disease | Admitting: Cardiovascular Disease

## 2019-09-29 DIAGNOSIS — I2510 Atherosclerotic heart disease of native coronary artery without angina pectoris: Secondary | ICD-10-CM

## 2019-10-10 ENCOUNTER — Other Ambulatory Visit: Payer: BC Managed Care – PPO

## 2019-10-16 ENCOUNTER — Telehealth: Payer: Self-pay

## 2019-10-16 DIAGNOSIS — I2510 Atherosclerotic heart disease of native coronary artery without angina pectoris: Secondary | ICD-10-CM

## 2019-10-16 NOTE — Telephone Encounter (Signed)
Called patient left message on personal voice mail coronary calcium score [phone].Dr.Berry advised needs a fasting lipid profile.Advised he can come to [facility] 8:00 am to 12:00 noon.No food.Water only.No lab appointment needed.Order placed.

## 2019-10-17 DIAGNOSIS — I2510 Atherosclerotic heart disease of native coronary artery without angina pectoris: Secondary | ICD-10-CM | POA: Diagnosis not present

## 2019-10-17 LAB — LIPID PANEL
Chol/HDL Ratio: 2.5 ratio (ref 0.0–5.0)
Cholesterol, Total: 166 mg/dL (ref 100–199)
HDL: 67 mg/dL (ref >39)
LDL Chol Calc (NIH): 70 mg/dL (ref 0–99)
Triglycerides: 173 mg/dL — ABNORMAL HIGH (ref 0–149)
VLDL Cholesterol Cal: 29 mg/dL (ref 5–40)

## 2020-05-12 DIAGNOSIS — E7800 Pure hypercholesterolemia, unspecified: Secondary | ICD-10-CM | POA: Diagnosis not present

## 2020-05-12 DIAGNOSIS — Z125 Encounter for screening for malignant neoplasm of prostate: Secondary | ICD-10-CM | POA: Diagnosis not present

## 2020-05-12 DIAGNOSIS — Z0000 Encounter for general adult medical examination without abnormal findings: Secondary | ICD-10-CM | POA: Diagnosis not present

## 2020-05-17 DIAGNOSIS — E7800 Pure hypercholesterolemia, unspecified: Secondary | ICD-10-CM | POA: Diagnosis not present

## 2020-05-17 DIAGNOSIS — S61012A Laceration without foreign body of left thumb without damage to nail, initial encounter: Secondary | ICD-10-CM | POA: Diagnosis not present

## 2020-05-17 DIAGNOSIS — Z0001 Encounter for general adult medical examination with abnormal findings: Secondary | ICD-10-CM | POA: Diagnosis not present

## 2020-05-17 DIAGNOSIS — M159 Polyosteoarthritis, unspecified: Secondary | ICD-10-CM | POA: Diagnosis not present

## 2020-05-26 DIAGNOSIS — Z96641 Presence of right artificial hip joint: Secondary | ICD-10-CM | POA: Diagnosis not present

## 2020-06-28 DIAGNOSIS — H903 Sensorineural hearing loss, bilateral: Secondary | ICD-10-CM | POA: Diagnosis not present

## 2020-06-28 DIAGNOSIS — I2510 Atherosclerotic heart disease of native coronary artery without angina pectoris: Secondary | ICD-10-CM | POA: Diagnosis not present

## 2020-06-28 DIAGNOSIS — J3489 Other specified disorders of nose and nasal sinuses: Secondary | ICD-10-CM | POA: Diagnosis not present

## 2020-06-28 DIAGNOSIS — H833X3 Noise effects on inner ear, bilateral: Secondary | ICD-10-CM | POA: Diagnosis not present

## 2020-07-13 DIAGNOSIS — Z85828 Personal history of other malignant neoplasm of skin: Secondary | ICD-10-CM | POA: Diagnosis not present

## 2020-07-13 DIAGNOSIS — L821 Other seborrheic keratosis: Secondary | ICD-10-CM | POA: Diagnosis not present

## 2020-07-13 DIAGNOSIS — L111 Transient acantholytic dermatosis [Grover]: Secondary | ICD-10-CM | POA: Diagnosis not present

## 2020-07-13 DIAGNOSIS — L570 Actinic keratosis: Secondary | ICD-10-CM | POA: Diagnosis not present

## 2020-07-16 ENCOUNTER — Other Ambulatory Visit: Payer: Self-pay

## 2020-07-16 ENCOUNTER — Ambulatory Visit (INDEPENDENT_AMBULATORY_CARE_PROVIDER_SITE_OTHER): Payer: BC Managed Care – PPO | Admitting: Cardiovascular Disease

## 2020-07-16 ENCOUNTER — Encounter: Payer: Self-pay | Admitting: Cardiovascular Disease

## 2020-07-16 VITALS — BP 120/80 | HR 72 | Ht 68.0 in | Wt 190.0 lb

## 2020-07-16 DIAGNOSIS — Z720 Tobacco use: Secondary | ICD-10-CM

## 2020-07-16 DIAGNOSIS — E782 Mixed hyperlipidemia: Secondary | ICD-10-CM

## 2020-07-16 DIAGNOSIS — I2510 Atherosclerotic heart disease of native coronary artery without angina pectoris: Secondary | ICD-10-CM

## 2020-07-16 DIAGNOSIS — E785 Hyperlipidemia, unspecified: Secondary | ICD-10-CM | POA: Insufficient documentation

## 2020-07-16 NOTE — Progress Notes (Addendum)
06/14/2021 Jared Hansen   Nov 18, 1960  [id number]  Primary Physician Merri Brunette, MD Primary Cardiologist: Runell Gess MD Nicholes Calamity, [place]  HPI:  Jared Hansen is a 60 y.o. thin and fit appearing married Caucasian male father 2 children referred by Dr. Renne Crigler for cardiovascular dilation because of coronary calcification seen on chest CT.  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  I last saw him in the office 09/24/2019. His risk factors include 20-pack-year tobacco abuse currently smoking 1/2 pack/day.  He has no other cardiac risk factors.  Is no family history of heart disease.  Never had a heart attack or stroke.  He denies chest pain or shortness of breath.  Does walk 10-15,000 steps a day.  He did have an uncomplicated total hip replacement by Dr. Charlann Boxer in December of last year.  Screening chest CT performed 09/01/2019 showed calcification in the LAD and circumflex territories.  Based on this I performed a coronary calcium score on 09/29/2019 which was measured at 398.  As result of this he was begun on Crestor 20 mg a day which reduced his LDL from 94 down to 53 on 3/53/6144.  He is completely asymptomatic.   Current Meds  Medication Sig   acyclovir (ZOVIRAX) 800 MG tablet Take 800 mg by mouth as needed.   aspirin 81 MG chewable tablet Chew 81 mg by mouth daily.    Cholecalciferol (VITAMIN D3) 50 MCG (2000 UT) TABS Take 2,000 Units by mouth daily.   colchicine 0.6 MG tablet Take 0.6 mg by mouth as needed.   Cyanocobalamin 3000 MCG CAPS Take 3,000 mcg by mouth daily.    Ginkgo Biloba 60 MG CAPS See admin instructions.   Menaquinone-7 (VITAMIN K2) 100 MCG CAPS Take 100 mcg by mouth daily.   Misc Natural Products (TART CHERRY ADVANCED) CAPS Take by mouth daily.   Multiple Vitamin (MULTIVITAMIN) tablet Take 1 tablet by mouth daily.   mupirocin nasal ointment (BACTROBAN) 2  % Apply a small amount to affected area twice daily.   nitroGLYCERIN (NITROSTAT) 0.4 MG SL tablet PLACE 1 TABLET UNDER THE TONGUE EVERY 5 MINUTES UP TO 3 TABLETS FOR CHEST PAIN. CALL 911 AT 15 MINUTES.   Probiotic CHEW See admin instructions.   QUERCETIN PO Take by mouth daily.   Selenium 200 MCG CAPS Take by mouth daily.     No Known Allergies  Social History   Socioeconomic History   Marital status: Married    Spouse name: Not on file   Number of children: Not on file   Years of education: Not on file   Highest education level: Not on file  Occupational History   Not on file  Tobacco Use   Smoking status: Every Day    Years: 42.00    Types: Cigarettes   Smokeless tobacco: Never   Tobacco comments:    1/2 per day   Substance and Sexual Activity   Alcohol use: Not Currently   Drug use: Not Currently   Sexual activity: Not Currently  Other Topics Concern   Not on file  Social History Narrative   Not on file   Social Determinants of Health   Financial Resource Strain: Not on file  Food Insecurity: Not on file  Transportation Needs: Not on file  Physical Activity: Not on file  Stress: Not on file  Social Connections: Not on  file  Intimate Partner Violence: Not on file     Review of Systems: General: negative for chills, fever, night sweats or weight changes.  Cardiovascular: negative for chest pain, dyspnea on exertion, edema, orthopnea, palpitations, paroxysmal nocturnal dyspnea or shortness of breath Dermatological: negative for rash Respiratory: negative for cough or wheezing Urologic: negative for hematuria Abdominal: negative for nausea, vomiting, diarrhea, bright red blood per rectum, melena, or hematemesis Neurologic: negative for visual changes, syncope, or dizziness All other systems reviewed and are otherwise negative except as noted above.    Blood pressure 120/80, pulse 72, height 5\' 8"  (1.727 m), weight 190 lb (86.2 kg), SpO2 97 %.  General  appearance: alert and no distress Neck: no adenopathy, no carotid bruit, no JVD, supple, symmetrical, trachea midline and thyroid not enlarged, symmetric, no tenderness/mass/nodules Lungs: clear to auscultation bilaterally Heart: regular rate and rhythm, S1, S2 normal, no murmur, click, rub or gallop Extremities: extremities normal, atraumatic, no cyanosis or edema Pulses: 2+ and symmetric Skin: Skin color, texture, turgor normal. No rashes or lesions Neurologic: Alert and oriented X 3, normal strength and tone. Normal symmetric reflexes. Normal coordination and gait  EKG sinus rhythm at 72 without ST or T wave changes.  I personally reviewed this EKG.  ASSESSMENT AND PLAN:   Coronary artery calcification seen on CT scan History of elevated coronary calcium score 398 measured on 09/29/2019.  He is totally asymptomatic.  Tobacco abuse Ongoing tobacco abuse 1/2 pack/day recalcitrant to risk factor modification.  Hyperlipidemia Mr. Whyte was begun on Crestor 20 mg a day because of mild hyperlipidemia and elevated coronary calcium score.  His LDL decreased from 94 on 05/12/2020 to 53  on 06/29/2020.  His most recent lipid profile performed 05/20/2021 revealed total cholesterol of 146, LDL 47 and HDL 77.      07/18/2021 MD [facility], [facility] 06/14/2021 4:27 PM

## 2020-07-16 NOTE — Patient Instructions (Signed)
Medication Instructions:  Your physician recommends that you continue on your current medications as directed. Please refer to the Current Medication list given to you today.  *If you need a refill on your cardiac medications before your next appointment, please call your pharmacy*   Follow-Up: At [facility], you and your health needs are our priority.  As part of our continuing mission to provide you with exceptional heart care, we have created designated Provider Care Teams.  These Care Teams include your primary Cardiologist (physician) and Advanced Practice Providers (APPs -  Physician Assistants and Nurse Practitioners) who all work together to provide you with the care you need, when you need it.  We recommend signing up for the patient portal called "MyChart".  Sign up information is provided on this After Visit Summary.  MyChart is used to connect with patients for Virtual Visits (Telemedicine).  Patients are able to view lab/test results, encounter notes, upcoming appointments, etc.  Non-urgent messages can be sent to your provider as well.   To learn more about what you can do with MyChart, go to [email or web address].    Your next appointment:   No future appointments made at this time. We will see you on an as needed basis. Please call our office if you need to make an appointment.  Provider:   Nanetta Batty, MD

## 2020-07-16 NOTE — Assessment & Plan Note (Addendum)
History of elevated coronary calcium score 398 measured on 09/29/2019.  He is totally asymptomatic.

## 2020-07-16 NOTE — Assessment & Plan Note (Signed)
Ongoing tobacco abuse 1/2 pack/day recalcitrant to risk factor modification. 

## 2020-07-16 NOTE — Assessment & Plan Note (Addendum)
Jared Hansen was begun on Crestor 20 mg a day because of mild hyperlipidemia and elevated coronary calcium score.  His LDL decreased from 94 on 05/12/2020 to 53  on 06/29/2020.  His most recent lipid profile performed 05/20/2021 revealed total cholesterol of 146, LDL 47 and HDL 77.

## 2020-09-22 ENCOUNTER — Other Ambulatory Visit: Payer: Self-pay | Admitting: Acute Care

## 2020-09-22 DIAGNOSIS — F17210 Nicotine dependence, cigarettes, uncomplicated: Secondary | ICD-10-CM

## 2020-10-05 ENCOUNTER — Ambulatory Visit
Admission: RE | Admit: 2020-10-05 | Discharge: 2020-10-05 | Disposition: A | Discharge status: Home / Self Care | Payer: BC Managed Care – PPO | Source: Ambulatory Visit | Attending: Acute Care | Admitting: Acute Care

## 2020-10-05 DIAGNOSIS — F17210 Nicotine dependence, cigarettes, uncomplicated: Secondary | ICD-10-CM

## 2020-10-21 NOTE — Progress Notes (Signed)
Please call patient and let them  know their  low dose Ct was read as a Lung RADS 1, negative study: no nodules or definitely benign nodules. Radiology recommendation is for a repeat LDCT in 12 months. .Please let them  know we will order and schedule their  annual screening scan for 09/2021. Please let them  know there was notation of CAD on their  scan.  Please remind the patient  that this is a non-gated exam therefore degree or severity of disease  cannot be determined. Please have them  follow up with their PCP regarding potential risk factor modification, dietary therapy or pharmacologic therapy if clinically indicated. Pt.  is  currently on statin therapy. Please place order for annual  screening scan for  09/2021 and fax results to PCP. Thanks so much.  3 vessel CA atherosclerosis  noted, make sure he is aware. Thanks so much

## 2020-10-22 ENCOUNTER — Encounter: Payer: Self-pay | Admitting: *Deleted

## 2020-10-22 DIAGNOSIS — F17210 Nicotine dependence, cigarettes, uncomplicated: Secondary | ICD-10-CM

## 2021-05-20 DIAGNOSIS — Z125 Encounter for screening for malignant neoplasm of prostate: Secondary | ICD-10-CM | POA: Diagnosis not present

## 2021-05-20 DIAGNOSIS — Z0000 Encounter for general adult medical examination without abnormal findings: Secondary | ICD-10-CM | POA: Diagnosis not present

## 2021-05-24 DIAGNOSIS — K219 Gastro-esophageal reflux disease without esophagitis: Secondary | ICD-10-CM | POA: Diagnosis not present

## 2021-05-24 DIAGNOSIS — I2510 Atherosclerotic heart disease of native coronary artery without angina pectoris: Secondary | ICD-10-CM | POA: Diagnosis not present

## 2021-05-24 DIAGNOSIS — F17210 Nicotine dependence, cigarettes, uncomplicated: Secondary | ICD-10-CM | POA: Diagnosis not present

## 2021-05-24 DIAGNOSIS — Z0000 Encounter for general adult medical examination without abnormal findings: Secondary | ICD-10-CM | POA: Diagnosis not present

## 2021-05-24 DIAGNOSIS — Z1212 Encounter for screening for malignant neoplasm of rectum: Secondary | ICD-10-CM | POA: Diagnosis not present

## 2021-05-24 DIAGNOSIS — B009 Herpesviral infection, unspecified: Secondary | ICD-10-CM | POA: Diagnosis not present

## 2021-05-25 ENCOUNTER — Other Ambulatory Visit: Payer: Self-pay | Admitting: Internal Medicine

## 2021-05-25 DIAGNOSIS — I6523 Occlusion and stenosis of bilateral carotid arteries: Secondary | ICD-10-CM

## 2021-06-02 ENCOUNTER — Ambulatory Visit
Admission: RE | Admit: 2021-06-02 | Discharge: 2021-06-02 | Disposition: A | Discharge status: Home / Self Care | Payer: BC Managed Care – PPO | Source: Ambulatory Visit | Attending: Internal Medicine | Admitting: Internal Medicine

## 2021-06-02 DIAGNOSIS — R55 Syncope and collapse: Secondary | ICD-10-CM | POA: Diagnosis not present

## 2021-06-02 DIAGNOSIS — Z720 Tobacco use: Secondary | ICD-10-CM | POA: Diagnosis not present

## 2021-06-02 DIAGNOSIS — I6523 Occlusion and stenosis of bilateral carotid arteries: Secondary | ICD-10-CM

## 2021-07-14 DIAGNOSIS — L570 Actinic keratosis: Secondary | ICD-10-CM | POA: Diagnosis not present

## 2021-08-04 ENCOUNTER — Ambulatory Visit (INDEPENDENT_AMBULATORY_CARE_PROVIDER_SITE_OTHER): Payer: BC Managed Care – PPO | Admitting: Neurology

## 2021-08-04 ENCOUNTER — Encounter: Payer: Self-pay | Admitting: Neurology

## 2021-08-04 ENCOUNTER — Other Ambulatory Visit: Payer: Self-pay

## 2021-08-04 VITALS — BP 126/84 | HR 69 | Ht 68.0 in | Wt 186.5 lb

## 2021-08-04 DIAGNOSIS — F17200 Nicotine dependence, unspecified, uncomplicated: Secondary | ICD-10-CM | POA: Insufficient documentation

## 2021-08-04 DIAGNOSIS — G478 Other sleep disorders: Secondary | ICD-10-CM | POA: Insufficient documentation

## 2021-08-04 DIAGNOSIS — R0683 Snoring: Secondary | ICD-10-CM | POA: Diagnosis not present

## 2021-08-04 NOTE — Progress Notes (Signed)
? ? ?SLEEP MEDICINE CLINIC ?  ? ?Provider:  Melvyn Novas, MD  ?Primary Care Physician:  Merri Brunette, MD ?[address] ?[place] [address]  ? ?  ?Referring Provider: Merri Brunette, Md ?[id number] [facility] [phone] ?[place],  [place] [address]  ?  ?  ?    ?Chief Complaint according to patient   ?Patient presents with:  ?  ? New Patient (Initial Visit)  ?   Pt referred by Dr. Merri Brunette for daytime sleepiness.   ?  ?  ?HISTORY OF PRESENT ILLNESS:  ?Jared Hansen is a 61 y.o. year old White or Caucasian male patient seen here as a referral on 08/04/2021 from Dr Renne Crigler. Marland Kitchen  ?Chief concern according to patient :  Pt referred by Dr. Merri Brunette for non restorative sleep, fatigue and daytime sleepiness. Last SS done in 2015 by Dois Davenport fuller, DDS as a series of 3 HSTs. .Waking up tired. Snores only when sleeping on back.  ?  ?Jared Hansen  has a past medical history of Syncope. Snoring, OSA, gout, lost weight - 30 pounds in 2018.  ?Uses a dental device made by dentist for Bruxism.  ?  ?Sleep relevant medical history: Nocturia 1-2 times only, enuresis 2 times in his life, Tonsillectomy. Hay fever.  ?  ? Family medical /sleep history: No other family member on CPAP with OSA, insomnia, sleep walkers.  ?  ?Social history:  Patient is working as Pharmacist, hospital- and lives in a household with spouse, adult children have moved on. One dog.  ?Tobacco use; 10 cigs/ a day.   ?ETOH use ; daily 2-3 drinks.  ?Caffeine intake in form of Coffee( before noon , 2-3 cups ). ?Regular exercise in form of walking.Marland Kitchen   ?Hobbies : golfing , yard work.  ? ?  ?  ?Sleep habits are as follows: The patient's dinner time is between 6-7 PM. The patient goes to bed at 12 PM and continues to sleep for 8 hours, wakes for one bathroom break, the first time at 4 AM.   ?The preferred sleep position is avoiding supine sleep, with the support of 2 pillows.  ?Dreams are reportedly frequent  ?9  AM is the usual  rise time. The patient wakes up spontaneously (mostly).  ?He reports not feeling refreshed or restored in AM, with symptoms of residual fatigue.  ?Naps are taken not taken- . ?  ?Review of Systems: ?Out of a complete 14 system review, the patient complains of only the following symptoms, and all other reviewed systems are negative.:  ?Fatigue, not sleepiness ,supine- snoring,  ? ?  ?How likely are you to doze in the following situations: ?0 = not likely, 1 = slight chance, 2 = moderate chance, 3 = high chance ?  ?Sitting and Reading? ?Watching Television? ?Sitting inactive in a public place (theater or meeting)? ?As a passenger in a car for an hour without a break? ?Lying down in the afternoon when circumstances permit? ?Sitting and talking to someone? ?Sitting quietly after lunch without alcohol? ?In a car, while stopped for a few minutes in traffic? ?  ?Total =  8/ 24 points  ? FSS endorsed at 22/ 63 points.  ?GDS 1/ 15 points.  ? ?Social History  ? ?Socioeconomic History  ? Marital status: Married  ?  Spouse name: [name]  ? Number of children: 2  ? Years of education: Not on file  ? Highest education level: Master's degree (e.g.,  MA, MS, MEng, MEd, MSW, [employer])  ?Occupational History  ? Not on file  ?Tobacco Use  ? Smoking status: Every Day  ?  Packs/day: 0.50  ?  Years: 42.00  ?  Pack years: 21.00  ?  Types: Cigarettes  ? Smokeless tobacco: Never  ?Substance and Sexual Activity  ? Alcohol use: Yes  ?  Comment: 4 x week  ? Drug use: Not Currently  ? Sexual activity: Not Currently  ?Other Topics Concern  ? Not on file  ?Social History Narrative  ? Live with wife  ? Right handed  ? Caffeine: 3 C per day  ? ?Social Determinants of Health  ? ?Financial Resource Strain: Not on file  ?Food Insecurity: Not on file  ?Transportation Needs: Not on file  ?Physical Activity: Not on file  ?Stress: Not on file  ?Social Connections: Not on file  ? ? ?Family History  ?Problem Relation Age of Onset  ? Cancer Mother   ? Diabetes  Father   ? ? ?Past Medical History:  ?Diagnosis Date  ? Syncope 2010 ? ?Gout when obese, prior to 2018   ? ? ?Past Surgical History:  ?Procedure Laterality Date  ? REVISION TOTAL HIP ARTHROPLASTY Right 2020  ?  ? ?Current Outpatient Medications on File Prior to Visit  ?Medication Sig Dispense Refill  ? acyclovir (ZOVIRAX) 800 MG tablet Take 800 mg by mouth as needed.    ? aspirin 81 MG chewable tablet Chew 81 mg by mouth daily.     ? Cholecalciferol (VITAMIN D3) 50 MCG (2000 UT) TABS Take 2,000 Units by mouth daily.    ? colchicine 0.6 MG tablet Take 0.6 mg by mouth as needed.    ? Cyanocobalamin 3000 MCG CAPS Take 3,000 mcg by mouth daily.     ? Ginkgo Biloba 60 MG CAPS See admin instructions.    ? Menaquinone-7 (VITAMIN K2) 100 MCG CAPS Take 100 mcg by mouth daily.    ? Misc Natural Products (TART CHERRY ADVANCED) CAPS Take by mouth daily.    ? Multiple Vitamin (MULTIVITAMIN) tablet Take 1 tablet by mouth daily.    ? mupirocin nasal ointment (BACTROBAN) 2 % Apply a small amount to affected area twice daily.    ? nitroGLYCERIN (NITROSTAT) 0.4 MG SL tablet PLACE 1 TABLET UNDER THE TONGUE EVERY 5 MINUTES UP TO 3 TABLETS FOR CHEST PAIN. CALL 911 AT 15 MINUTES.    ? Probiotic CHEW See admin instructions.    ? QUERCETIN PO Take by mouth daily.    ? Selenium 200 MCG CAPS Take by mouth daily.    ? ?No current facility-administered medications on file prior to visit.  ? ? ?No Known Allergies ? ?Physical exam: ? ?Today's Vitals  ? 08/04/21 1255  ?BP: 126/84  ?Pulse: 69  ?Weight: 186 lb 8 oz (84.6 kg)  ?Height: 5\' 8"  (1.727 m)  ? ?Body mass index is 28.36 kg/m?.  ? ?Wt Readings from Last 3 Encounters:  ?08/04/21 186 lb 8 oz (84.6 kg)  ?07/16/20 190 lb (86.2 kg)  ?09/24/19 187 lb (84.8 kg)  ?  ? ?Ht Readings from Last 3 Encounters:  ?08/04/21 5\' 8"  (1.727 m)  ?07/16/20 5\' 8"  (1.727 m)  ?09/24/19 5\' 8"  (1.727 m)  ?  ?  ?General: The patient is awake, alert and appears not in acute distress. The patient is well groomed. ?Head:  Normocephalic, atraumatic. Neck is supple.  ?Mallampati 1-2,  ?neck circumference:16.5 inches .  ?Nasal airflow patent.  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  ?Dental status:  retrognathia.  ?Cardiovascular:  Regular rate and cardiac rhythm by pulse,  without distended neck veins. ?Respiratory: Lungs are clear to auscultation.  ?Skin:  Without evidence of ankle edema, or rash. ?Trunk: The patient's posture is erect. ?  ?Neurologic exam : ?The patient is awake and alert, oriented to place and time.   ?Memory subjective described as intact.  ?Attention span & concentration ability appears normal.  ?Speech is fluent,  without  dysarthria, dysphonia or aphasia.  ?Mood and affect are appropriate. ?  ?Cranial nerves: no loss of smell or taste reported  ?Pupils are equal and briskly reactive to light. Funduscopic exam: deferred.Marland Kitchen.  ?Extraocular movements in vertical and horizontal planes were intact and without nystagmus. No Diplopia. ?Visual fields by finger perimetry are intact. ?Hearing was intact to soft voice and finger rubbing.   ? Facial sensation intact to fine touch. ? Facial motor strength is symmetric and tongue and uvula move midline. Scalloped tongue.  ?Neck ROM : rotation, tilt and flexion extension were normal for age and shoulder shrug was symmetrical.  ?  ?Motor exam:  Symmetric bulk, tone and ROM.   ?Normal tone without cog- wheeling, symmetric grip strength . ?  ?Sensory:  Fine touch and vibration were normal.  ?Proprioception tested in the upper extremities was normal. ?  ?Coordination: Rapid alternating movements in the fingers/hands were of normal speed.  ?The Finger-to-nose maneuver was intact without evidence of ataxia, dysmetria or tremor. ?  ?Gait and station: Patient could rise unassisted from a seated position, walked without assistive device.  ?Stance is of normal width/ base.  ?Toe and heel walk were deferred.  ?Deep tendon reflexes: in the upper and lower extremities are symmetric and intact.   ?Babinski response was deferred.  ?  ?  ?  ?After spending a total time of  35  minutes face to face and additional time for physical and neurologic examination, review of laboratory studies,  ?personal revi

## 2021-08-04 NOTE — Patient Instructions (Signed)

## 2021-08-15 ENCOUNTER — Telehealth: Payer: Self-pay

## 2021-08-15 NOTE — Telephone Encounter (Signed)
LVM for pt to call me back to schedule sleep study  

## 2021-08-24 ENCOUNTER — Ambulatory Visit (INDEPENDENT_AMBULATORY_CARE_PROVIDER_SITE_OTHER): Payer: BC Managed Care – PPO | Admitting: Neurology

## 2021-08-24 DIAGNOSIS — R0683 Snoring: Secondary | ICD-10-CM

## 2021-08-24 DIAGNOSIS — F17200 Nicotine dependence, unspecified, uncomplicated: Secondary | ICD-10-CM

## 2021-08-24 DIAGNOSIS — G478 Other sleep disorders: Secondary | ICD-10-CM

## 2021-08-24 DIAGNOSIS — G4733 Obstructive sleep apnea (adult) (pediatric): Secondary | ICD-10-CM | POA: Diagnosis not present

## 2021-08-30 NOTE — Progress Notes (Signed)
? ? ? ? ?  ?  ?[facility] ?  ?HOME SLEEP TEST REPORT ( by Watch PAT)   ?STUDY DATA:  08-30-2021 ?  ?ORDERING CLINICIAN: Larey Seat, MD  ?REFERRING CLINICIAN: Dr Shelia Media.  ?  ?CLINICAL INFORMATION/HISTORY: Jared Hansen is a 61 y.o. Caucasian male patient seen on 08/04/2021 , upon referral from Dr Shelia Media.  ?Chief concern  :  Waking up tired. Snores only when sleeping on back, reports non restorative sleep, fatigue and daytime sleepiness.  ?Last SS done in 2015 by Katharine Look fuller, DDS as a series of 3 HSTs. Marland Kitchen  ?Jared Hansen  has a past medical history of Syncope. Snoring, OSA, Gout, and he lost weight - 30 pounds in 2018.  ?Sleep relevant medical history: Tonsillectomy. Hay fever.Uses a dental device made by dentist for Bruxism.   ? ?Epworth sleepiness score: 8/24. FSS at 22/ 63 points, no naps. ?  ?BMI: 28.1 kg/m? ?  ?Neck Circumference: 17" ?  ?FINDINGS: ?  ?Sleep Summary: ?  ?Total Recording Time (hours, min): Total recording time amounted to 8 hours 47 minutes of which the total sleep time was calculated at 7 hours 43 minutes.     ? Percent REM (%): 23.6%                                    ?  ?Respiratory Indices: ?  ?Calculated pAHI (per hour):   15.9/h                        ?  ?REM pAHI:   16/h                                            ?  ?NREM pAHI:    15.9/h   ?                      ?Positional AHI: The patient slept mostly on his left side for 207 minutes.  AHI here was 1.5/h.  This was followed by 188 minutes in supine sleep with an AHI of 34.2/h.  Sleep on his right side was seen for 69 minutes with an AHI of 9.7/h.     ? ?Snoring data show a mean volume of 41 dB only accompanying 8.8% of the total sleep time.    ?Oxygen Saturation Statistics:    ?  ?O2 Saturation Range (%): Between a nadir of 85 and a maximum of 98% with a mean saturation at 93%.                                    ?  ?O2 Saturation (minutes) <89%:   0.8 minutes      ?  ?Pulse Rate Statistics: ?   ?Pulse Range:    Between 59 and 127 bpm with a mean heart rate of 76 bpm.            ?  ?IMPRESSION:  This HST confirms the presence of mild sleep apnea which was not REM sleep dependent and not associated with hypoxia.  The sleep apnea was strictly dependent on the patient's sleep position can be treated by avoiding supine sleep alone.   ?  Snoring was not clinically impressive either:  only 8 5 of sleep were accompanied by snoring- ?If these data were collected while the patient was using his snoring guard one would consider the effect efficient. ?Should the patient not have used his snoring guard during this night I doubt that he will have to use it at all.                                           ?  ?RECOMMENDATION: There may be barriers to the patient's ability to avoid supine sleep, but I would recommend a sleep balance device by [employer] which will automatically wake him if he resumes the sleep position on his back.   ?This can reduce his apnea enough to not have to use positive airway pressure therapy nor a dental device. ? ?  ?INTERPRETING PHYSICIAN: ? ? Larey Seat, MD  ? ?Medical Director of [facility].  ? ? ? ? ? ? ? ? ? ? ? ? ? ? ? ? ? ? ?

## 2021-09-04 NOTE — Procedures (Signed)
? ? ?  ?  ?[facility] ?  ?HOME SLEEP TEST REPORT ( by Watch PAT)   ?STUDY DATA:  08-30-2021 ?  ?ORDERING CLINICIAN: Larey Seat, MD  ?REFERRING CLINICIAN: Dr Shelia Media.  ?  ?CLINICAL INFORMATION/HISTORY: Jared Hansen is a 61 y.o. Caucasian male patient seen on 08/04/2021 , upon referral from Dr Shelia Media.  ?Chief concern  :  Waking up tired. Snores only when sleeping on back, reports non restorative sleep, fatigue and daytime sleepiness.  ?Last SS done in 2015 by Katharine Look fuller, DDS as a series of 3 HSTs. Marland Kitchen  ?Jared Hansen  has a past medical history of Syncope. Snoring, OSA, Gout, and he lost weight - 30 pounds in 2018.  ?Sleep relevant medical history: Tonsillectomy. Hay fever.Uses a dental device made by dentist for Bruxism.   ? ?Epworth sleepiness score: 8/24. FSS at 22/ 63 points, no naps. ?  ?BMI: 28.1 kg/m? ?  ?Neck Circumference: 17" ?  ?FINDINGS: ?  ?Sleep Summary: ?  ?Total Recording Time (hours, min): Total recording time amounted to 8 hours 47 minutes of which the total sleep time was calculated at 7 hours 43 minutes.     ? Percent REM (%): 23.6%                                    ?  ?Respiratory Indices: ?  ?Calculated pAHI (per hour):   15.9/h                        ?  ?REM pAHI:   16/h                                            ?  ?NREM pAHI:    15.9/h   ?                      ?Positional AHI: The patient slept mostly on his left side for 207 minutes.  AHI here was 1.5/h.  This was followed by 188 minutes in supine sleep with an AHI of 34.2/h.  Sleep on his right side was seen for 69 minutes with an AHI of 9.7/h.     ? ?Snoring data show a mean volume of 41 dB only accompanying 8.8% of the total sleep time.    ?Oxygen Saturation Statistics:    ?  ?O2 Saturation Range (%): Between a nadir of 85 and a maximum of 98% with a mean saturation at 93%.                                    ?  ?O2 Saturation (minutes) <89%:   0.8 minutes      ?  ?Pulse Rate Statistics: ?   ?Pulse Range:    Between 59 and 127 bpm with a mean heart rate of 76 bpm.            ?  ?IMPRESSION:  This HST confirms the presence of mild sleep apnea which was not REM sleep dependent and not associated with hypoxia.  The sleep apnea was strictly dependent on the patient's sleep position can be treated by avoiding supine sleep alone.   ?Snoring  was not clinically impressive either:  only 8 5 of sleep were accompanied by snoring- ?If these data were collected while the patient was using his snoring guard one would consider the effect efficient. ?Should the patient not have used his snoring guard during this night I doubt that he will have to use it at all.                                           ?  ?RECOMMENDATION: There may be barriers to the patient's ability to avoid supine sleep, but I would recommend a sleep balance device by [employer] which will automatically wake him if he resumes the sleep position on his back.   ?This can reduce his apnea enough to not have to use positive airway pressure therapy nor a dental device. ? ?  ?INTERPRETING PHYSICIAN: ? ? Larey Seat, MD  ? ?Medical Director of [facility].  ? ? ? ? ? ? ? ? ? ? ? ? ? ? ? ? ? ? ?

## 2021-09-05 ENCOUNTER — Encounter: Payer: Self-pay | Admitting: Neurology

## 2021-10-05 ENCOUNTER — Ambulatory Visit
Admission: RE | Admit: 2021-10-05 | Discharge: 2021-10-05 | Disposition: A | Discharge status: Home / Self Care | Payer: BC Managed Care – PPO | Source: Ambulatory Visit | Attending: Internal Medicine | Admitting: Internal Medicine

## 2021-10-05 DIAGNOSIS — F17210 Nicotine dependence, cigarettes, uncomplicated: Secondary | ICD-10-CM | POA: Diagnosis not present

## 2021-10-05 DIAGNOSIS — J439 Emphysema, unspecified: Secondary | ICD-10-CM | POA: Diagnosis not present

## 2021-10-05 DIAGNOSIS — K8020 Calculus of gallbladder without cholecystitis without obstruction: Secondary | ICD-10-CM | POA: Diagnosis not present

## 2021-10-05 DIAGNOSIS — I2510 Atherosclerotic heart disease of native coronary artery without angina pectoris: Secondary | ICD-10-CM | POA: Diagnosis not present

## 2021-10-11 ENCOUNTER — Other Ambulatory Visit: Payer: Self-pay

## 2021-10-11 DIAGNOSIS — Z122 Encounter for screening for malignant neoplasm of respiratory organs: Secondary | ICD-10-CM

## 2021-10-11 DIAGNOSIS — F17210 Nicotine dependence, cigarettes, uncomplicated: Secondary | ICD-10-CM

## 2021-10-11 DIAGNOSIS — Z87891 Personal history of nicotine dependence: Secondary | ICD-10-CM

## 2021-10-31 DIAGNOSIS — M62830 Muscle spasm of back: Secondary | ICD-10-CM | POA: Diagnosis not present

## 2021-10-31 DIAGNOSIS — M9903 Segmental and somatic dysfunction of lumbar region: Secondary | ICD-10-CM | POA: Diagnosis not present

## 2021-10-31 DIAGNOSIS — M4722 Other spondylosis with radiculopathy, cervical region: Secondary | ICD-10-CM | POA: Diagnosis not present

## 2021-10-31 DIAGNOSIS — M9901 Segmental and somatic dysfunction of cervical region: Secondary | ICD-10-CM | POA: Diagnosis not present

## 2021-10-31 DIAGNOSIS — M4726 Other spondylosis with radiculopathy, lumbar region: Secondary | ICD-10-CM | POA: Diagnosis not present

## 2021-11-07 DIAGNOSIS — M25571 Pain in right ankle and joints of right foot: Secondary | ICD-10-CM | POA: Diagnosis not present

## 2021-11-07 DIAGNOSIS — M9906 Segmental and somatic dysfunction of lower extremity: Secondary | ICD-10-CM | POA: Diagnosis not present

## 2021-11-07 DIAGNOSIS — M4722 Other spondylosis with radiculopathy, cervical region: Secondary | ICD-10-CM | POA: Diagnosis not present

## 2021-11-07 DIAGNOSIS — M9901 Segmental and somatic dysfunction of cervical region: Secondary | ICD-10-CM | POA: Diagnosis not present

## 2021-11-07 DIAGNOSIS — M62830 Muscle spasm of back: Secondary | ICD-10-CM | POA: Diagnosis not present

## 2021-11-07 DIAGNOSIS — M4726 Other spondylosis with radiculopathy, lumbar region: Secondary | ICD-10-CM | POA: Diagnosis not present

## 2021-11-10 DIAGNOSIS — M4722 Other spondylosis with radiculopathy, cervical region: Secondary | ICD-10-CM | POA: Diagnosis not present

## 2021-11-10 DIAGNOSIS — M62830 Muscle spasm of back: Secondary | ICD-10-CM | POA: Diagnosis not present

## 2021-11-10 DIAGNOSIS — M9901 Segmental and somatic dysfunction of cervical region: Secondary | ICD-10-CM | POA: Diagnosis not present

## 2021-11-10 DIAGNOSIS — M4726 Other spondylosis with radiculopathy, lumbar region: Secondary | ICD-10-CM | POA: Diagnosis not present

## 2021-11-16 DIAGNOSIS — M62830 Muscle spasm of back: Secondary | ICD-10-CM | POA: Diagnosis not present

## 2021-11-16 DIAGNOSIS — M4722 Other spondylosis with radiculopathy, cervical region: Secondary | ICD-10-CM | POA: Diagnosis not present

## 2021-11-16 DIAGNOSIS — M4726 Other spondylosis with radiculopathy, lumbar region: Secondary | ICD-10-CM | POA: Diagnosis not present

## 2021-11-16 DIAGNOSIS — M9901 Segmental and somatic dysfunction of cervical region: Secondary | ICD-10-CM | POA: Diagnosis not present

## 2021-11-18 DIAGNOSIS — M4722 Other spondylosis with radiculopathy, cervical region: Secondary | ICD-10-CM | POA: Diagnosis not present

## 2021-11-18 DIAGNOSIS — M9901 Segmental and somatic dysfunction of cervical region: Secondary | ICD-10-CM | POA: Diagnosis not present

## 2021-11-18 DIAGNOSIS — M62830 Muscle spasm of back: Secondary | ICD-10-CM | POA: Diagnosis not present

## 2021-11-18 DIAGNOSIS — M4726 Other spondylosis with radiculopathy, lumbar region: Secondary | ICD-10-CM | POA: Diagnosis not present

## 2021-11-21 DIAGNOSIS — M4722 Other spondylosis with radiculopathy, cervical region: Secondary | ICD-10-CM | POA: Diagnosis not present

## 2021-11-21 DIAGNOSIS — M4726 Other spondylosis with radiculopathy, lumbar region: Secondary | ICD-10-CM | POA: Diagnosis not present

## 2021-11-21 DIAGNOSIS — M9901 Segmental and somatic dysfunction of cervical region: Secondary | ICD-10-CM | POA: Diagnosis not present

## 2021-11-21 DIAGNOSIS — M62830 Muscle spasm of back: Secondary | ICD-10-CM | POA: Diagnosis not present

## 2021-11-24 DIAGNOSIS — M9901 Segmental and somatic dysfunction of cervical region: Secondary | ICD-10-CM | POA: Diagnosis not present

## 2021-11-24 DIAGNOSIS — M4722 Other spondylosis with radiculopathy, cervical region: Secondary | ICD-10-CM | POA: Diagnosis not present

## 2021-11-24 DIAGNOSIS — M4726 Other spondylosis with radiculopathy, lumbar region: Secondary | ICD-10-CM | POA: Diagnosis not present

## 2021-11-24 DIAGNOSIS — M9906 Segmental and somatic dysfunction of lower extremity: Secondary | ICD-10-CM | POA: Diagnosis not present

## 2021-11-24 DIAGNOSIS — M62830 Muscle spasm of back: Secondary | ICD-10-CM | POA: Diagnosis not present

## 2021-11-24 DIAGNOSIS — M25571 Pain in right ankle and joints of right foot: Secondary | ICD-10-CM | POA: Diagnosis not present

## 2021-11-28 DIAGNOSIS — M9901 Segmental and somatic dysfunction of cervical region: Secondary | ICD-10-CM | POA: Diagnosis not present

## 2021-11-28 DIAGNOSIS — M62830 Muscle spasm of back: Secondary | ICD-10-CM | POA: Diagnosis not present

## 2021-11-28 DIAGNOSIS — M4722 Other spondylosis with radiculopathy, cervical region: Secondary | ICD-10-CM | POA: Diagnosis not present

## 2021-11-28 DIAGNOSIS — M4726 Other spondylosis with radiculopathy, lumbar region: Secondary | ICD-10-CM | POA: Diagnosis not present

## 2021-12-01 DIAGNOSIS — M9901 Segmental and somatic dysfunction of cervical region: Secondary | ICD-10-CM | POA: Diagnosis not present

## 2021-12-01 DIAGNOSIS — M4722 Other spondylosis with radiculopathy, cervical region: Secondary | ICD-10-CM | POA: Diagnosis not present

## 2021-12-01 DIAGNOSIS — M4726 Other spondylosis with radiculopathy, lumbar region: Secondary | ICD-10-CM | POA: Diagnosis not present

## 2021-12-01 DIAGNOSIS — M62830 Muscle spasm of back: Secondary | ICD-10-CM | POA: Diagnosis not present

## 2021-12-08 DIAGNOSIS — M62830 Muscle spasm of back: Secondary | ICD-10-CM | POA: Diagnosis not present

## 2021-12-08 DIAGNOSIS — M9901 Segmental and somatic dysfunction of cervical region: Secondary | ICD-10-CM | POA: Diagnosis not present

## 2021-12-08 DIAGNOSIS — M4726 Other spondylosis with radiculopathy, lumbar region: Secondary | ICD-10-CM | POA: Diagnosis not present

## 2021-12-08 DIAGNOSIS — M4722 Other spondylosis with radiculopathy, cervical region: Secondary | ICD-10-CM | POA: Diagnosis not present

## 2021-12-15 DIAGNOSIS — M62830 Muscle spasm of back: Secondary | ICD-10-CM | POA: Diagnosis not present

## 2021-12-15 DIAGNOSIS — M9901 Segmental and somatic dysfunction of cervical region: Secondary | ICD-10-CM | POA: Diagnosis not present

## 2021-12-15 DIAGNOSIS — M4726 Other spondylosis with radiculopathy, lumbar region: Secondary | ICD-10-CM | POA: Diagnosis not present

## 2021-12-15 DIAGNOSIS — M4722 Other spondylosis with radiculopathy, cervical region: Secondary | ICD-10-CM | POA: Diagnosis not present

## 2021-12-22 DIAGNOSIS — M4726 Other spondylosis with radiculopathy, lumbar region: Secondary | ICD-10-CM | POA: Diagnosis not present

## 2021-12-22 DIAGNOSIS — M62830 Muscle spasm of back: Secondary | ICD-10-CM | POA: Diagnosis not present

## 2021-12-22 DIAGNOSIS — Z96641 Presence of right artificial hip joint: Secondary | ICD-10-CM | POA: Diagnosis not present

## 2021-12-22 DIAGNOSIS — M25552 Pain in left hip: Secondary | ICD-10-CM | POA: Diagnosis not present

## 2021-12-22 DIAGNOSIS — M4722 Other spondylosis with radiculopathy, cervical region: Secondary | ICD-10-CM | POA: Diagnosis not present

## 2021-12-22 DIAGNOSIS — M25551 Pain in right hip: Secondary | ICD-10-CM | POA: Diagnosis not present

## 2021-12-22 DIAGNOSIS — M9901 Segmental and somatic dysfunction of cervical region: Secondary | ICD-10-CM | POA: Diagnosis not present

## 2021-12-22 DIAGNOSIS — M7061 Trochanteric bursitis, right hip: Secondary | ICD-10-CM | POA: Diagnosis not present

## 2021-12-29 DIAGNOSIS — M62830 Muscle spasm of back: Secondary | ICD-10-CM | POA: Diagnosis not present

## 2021-12-29 DIAGNOSIS — M4726 Other spondylosis with radiculopathy, lumbar region: Secondary | ICD-10-CM | POA: Diagnosis not present

## 2021-12-29 DIAGNOSIS — M4722 Other spondylosis with radiculopathy, cervical region: Secondary | ICD-10-CM | POA: Diagnosis not present

## 2021-12-29 DIAGNOSIS — M9901 Segmental and somatic dysfunction of cervical region: Secondary | ICD-10-CM | POA: Diagnosis not present

## 2022-01-05 DIAGNOSIS — M25571 Pain in right ankle and joints of right foot: Secondary | ICD-10-CM | POA: Diagnosis not present

## 2022-01-05 DIAGNOSIS — M4722 Other spondylosis with radiculopathy, cervical region: Secondary | ICD-10-CM | POA: Diagnosis not present

## 2022-01-05 DIAGNOSIS — M4726 Other spondylosis with radiculopathy, lumbar region: Secondary | ICD-10-CM | POA: Diagnosis not present

## 2022-01-05 DIAGNOSIS — M9901 Segmental and somatic dysfunction of cervical region: Secondary | ICD-10-CM | POA: Diagnosis not present

## 2022-01-05 DIAGNOSIS — M62830 Muscle spasm of back: Secondary | ICD-10-CM | POA: Diagnosis not present

## 2022-01-06 IMAGING — CT CT CHEST LUNG CANCER SCREENING LOW DOSE W/O CM
2 of 5 series · 15 of 40 positions shown, 18 images · non-contrast
Comparison: No priors.

CLINICAL DATA: 58-year-old male current smoker with greater than 30
pack-year history of smoking. Lung cancer screening examination.

EXAM:
CT CHEST WITHOUT CONTRAST LOW-DOSE FOR LUNG CANCER SCREENING
TECHNIQUE: Multidetector CT imaging of the chest was performed following the
standard protocol without IV contrast.

[Series 4: lung 1.00 br44 cor · coronal · 0.68mm/px · 3 of 307 slices shown]
[im 62/307  lung]
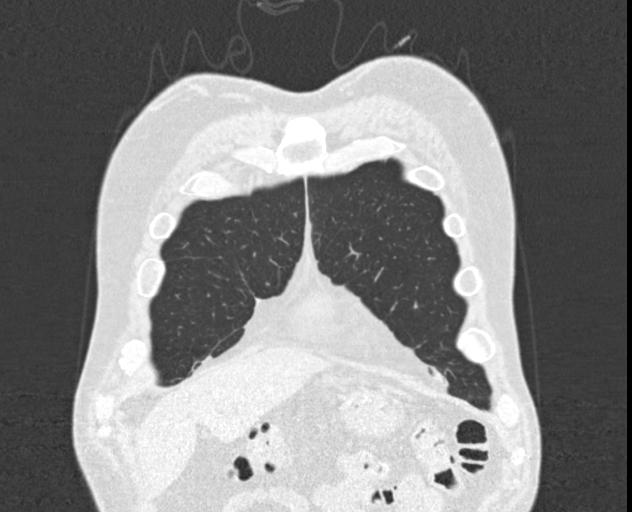
[im 123/307  lung]
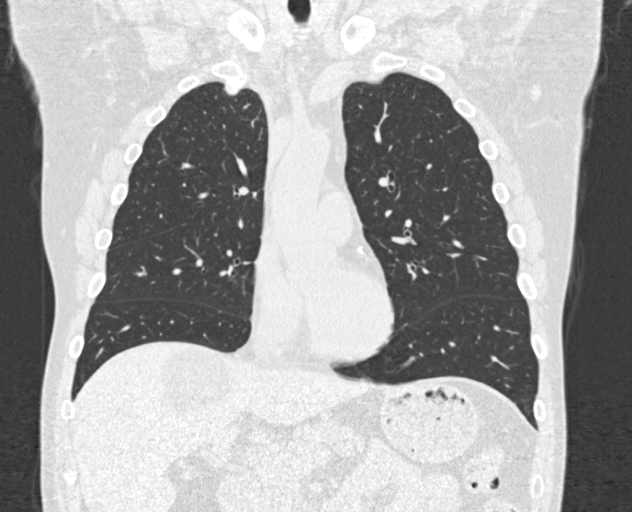
[im 184/307  lung]
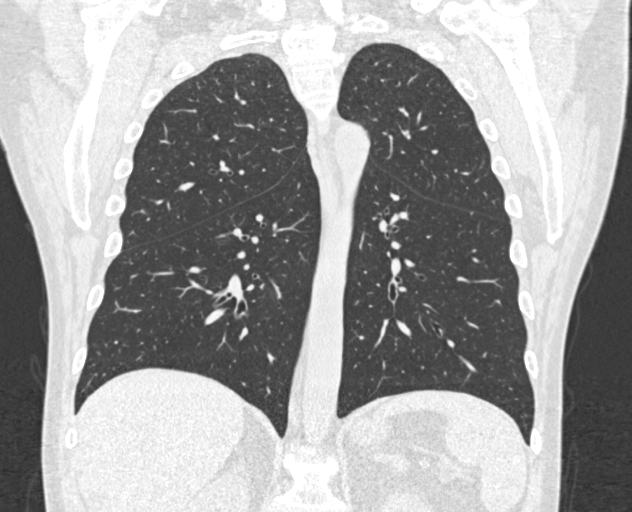

[Series 9: lung 1.00 br60 axial · axial · 0.78mm/px · z∈[-1043,-731]mm · 12 of 346 slices shown, 15 images]
[im 17/346  mediastinal]
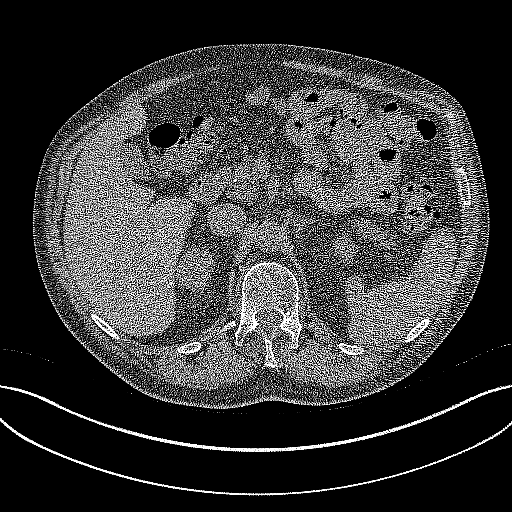
[im 17/346  lung]
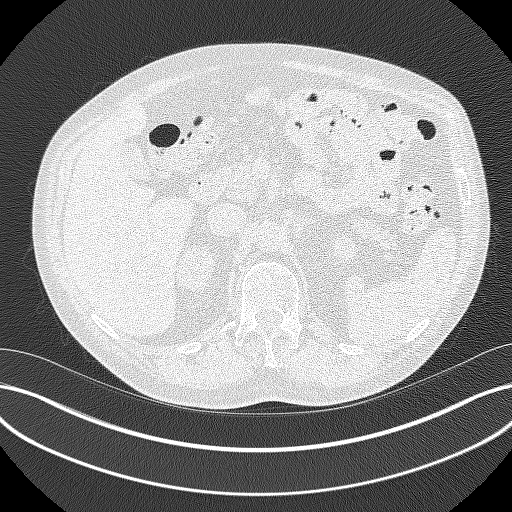
[im 50/346  lung]
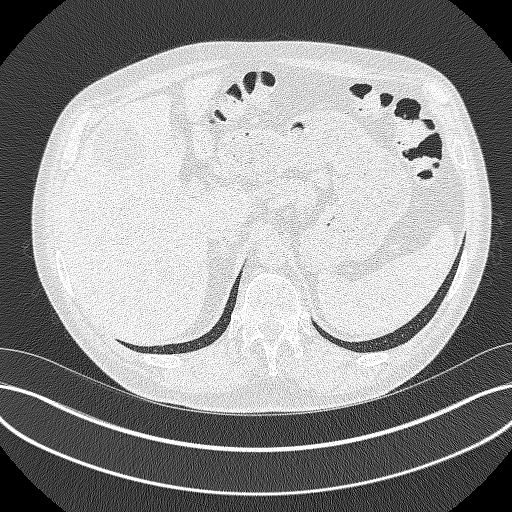
[im 83/346  lung]
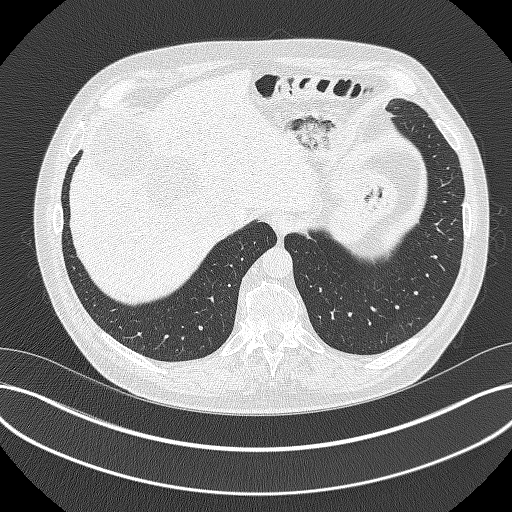
[im 99/346  lung]
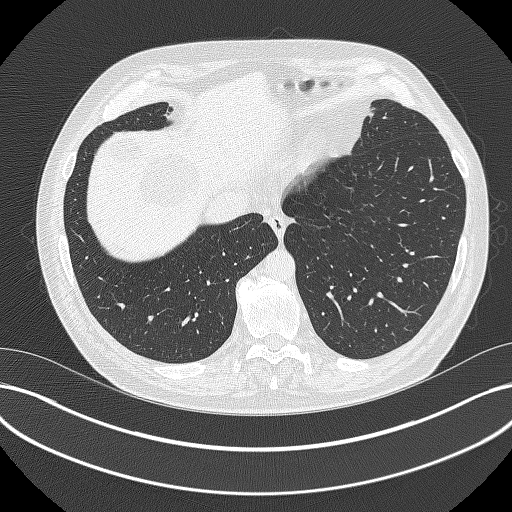
[im 132/346  mediastinal]
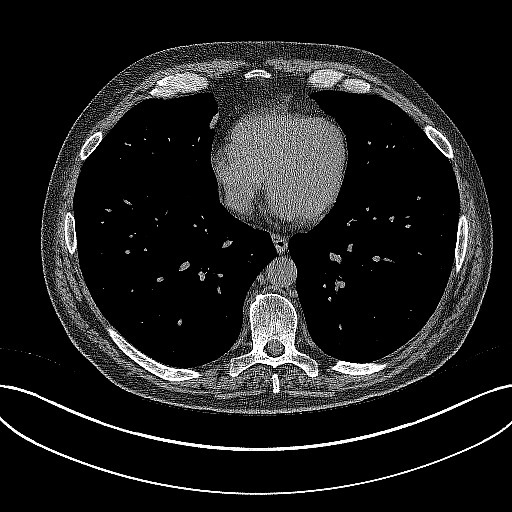
[im 132/346  lung]
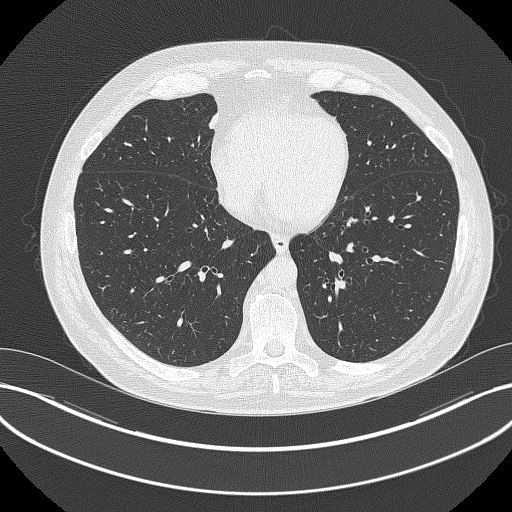
[im 165/346  lung]
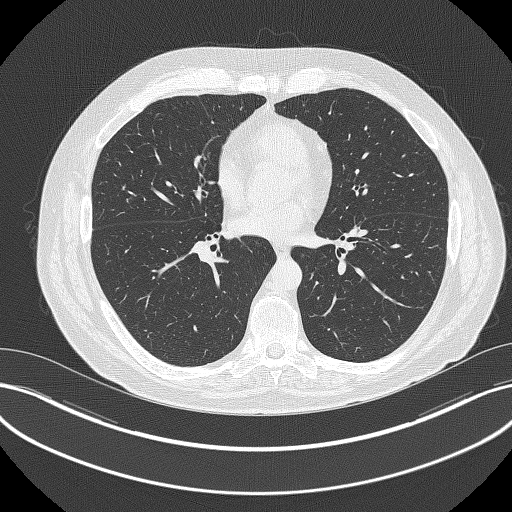
[im 181/346  lung]
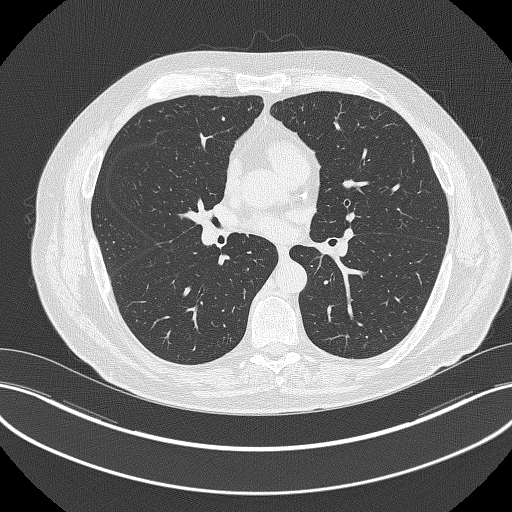
[im 214/346  lung]
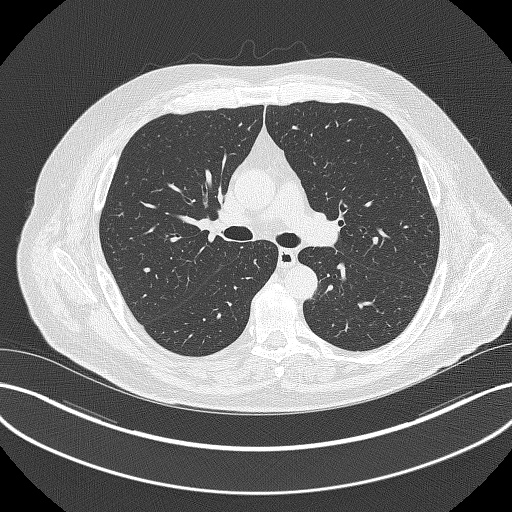
[im 247/346  mediastinal]
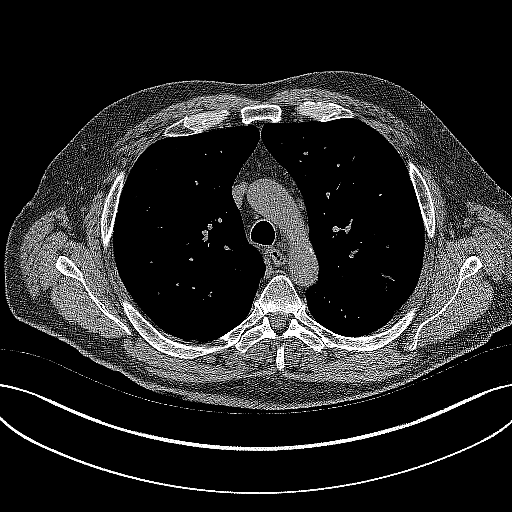
[im 247/346  lung]
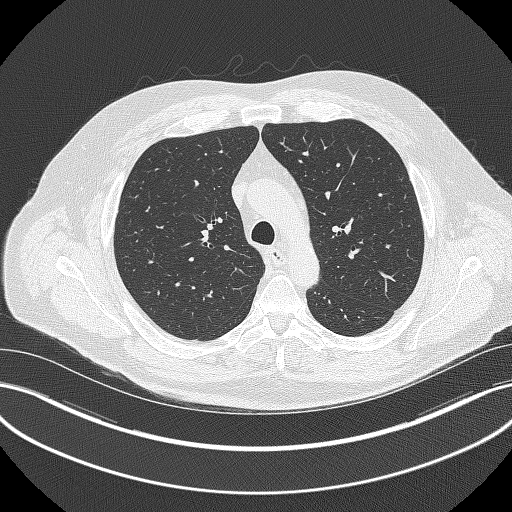
[im 263/346  lung]
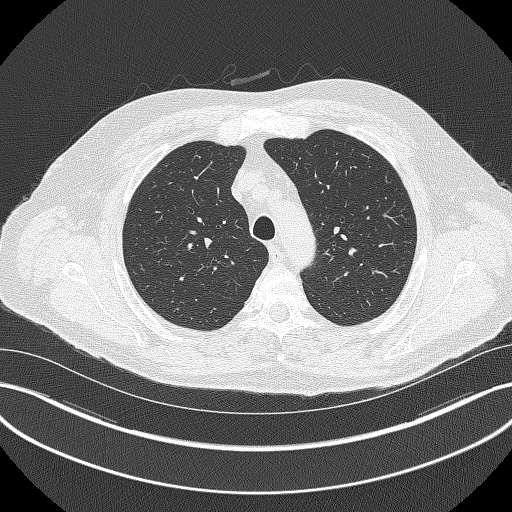
[im 296/346  lung]
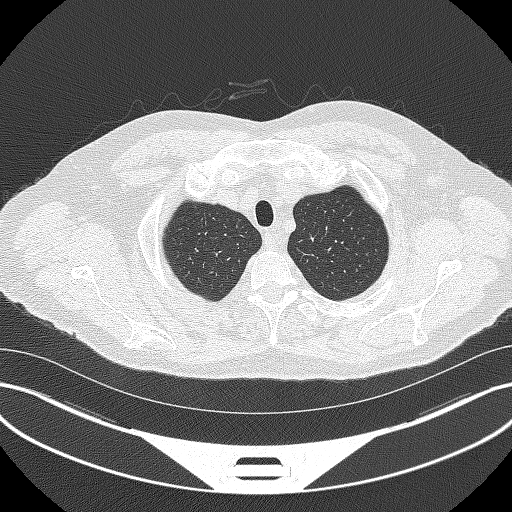
[im 329/346  lung]
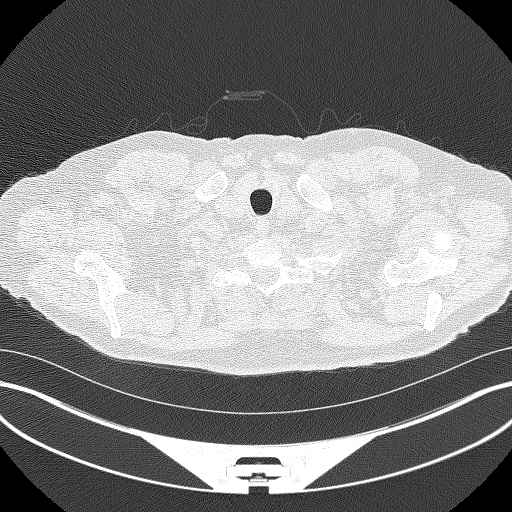

[15 of 40 positions shown; findings below may reference images not displayed]

FINDINGS: Cardiovascular: Heart size is normal. There is no significant
pericardial fluid, thickening or pericardial calcification. There is
aortic atherosclerosis, as well as atherosclerosis of the great
vessels of the mediastinum and the coronary arteries, including
calcified atherosclerotic plaque in the left anterior descending and
left circumflex coronary arteries.

Mediastinum/Nodes: No pathologically enlarged mediastinal or hilar
lymph nodes. Esophagus is unremarkable in appearance. No axillary
lymphadenopathy.

Lungs/Pleura: No suspicious appearing pulmonary nodules or masses
are noted. No acute consolidative airspace disease. No pleural
effusions.

Upper Abdomen: Aortic atherosclerosis. Multiple low-attenuation
lesions noted throughout the visualized hepatic parenchyma,
incompletely characterized on today's non-contrast CT examination,
but statistically likely to represent cysts, largest of which
measures 5.7 cm. Aortic atherosclerosis.

Musculoskeletal: There are no aggressive appearing lytic or blastic
lesions noted in the visualized portions of the skeleton.
IMPRESSION: 1. Lung-RADS 1S, negative. Continue annual screening with low-dose
chest CT without contrast in 12 months.
2. The "S" modifier above refers to potentially clinically
significant non lung cancer related findings. Specifically, there is
aortic atherosclerosis, in addition to 2 vessel coronary artery
disease. Please note that although the presence of coronary artery
calcium documents the presence of coronary artery disease, the
severity of this disease and any potential stenosis cannot be
assessed on this non-gated CT examination. Assessment for potential
risk factor modification, dietary therapy or pharmacologic therapy
may be warranted, if clinically indicated.

Aortic Atherosclerosis ([id number]).

## 2022-05-26 DIAGNOSIS — Z0000 Encounter for general adult medical examination without abnormal findings: Secondary | ICD-10-CM | POA: Diagnosis not present

## 2022-05-26 DIAGNOSIS — Z125 Encounter for screening for malignant neoplasm of prostate: Secondary | ICD-10-CM | POA: Diagnosis not present

## 2022-05-31 DIAGNOSIS — I2510 Atherosclerotic heart disease of native coronary artery without angina pectoris: Secondary | ICD-10-CM | POA: Diagnosis not present

## 2022-05-31 DIAGNOSIS — F17210 Nicotine dependence, cigarettes, uncomplicated: Secondary | ICD-10-CM | POA: Diagnosis not present

## 2022-05-31 DIAGNOSIS — I6523 Occlusion and stenosis of bilateral carotid arteries: Secondary | ICD-10-CM | POA: Diagnosis not present

## 2022-07-19 DIAGNOSIS — L570 Actinic keratosis: Secondary | ICD-10-CM | POA: Diagnosis not present

## 2022-07-19 DIAGNOSIS — Z85828 Personal history of other malignant neoplasm of skin: Secondary | ICD-10-CM | POA: Diagnosis not present

## 2022-07-19 DIAGNOSIS — L821 Other seborrheic keratosis: Secondary | ICD-10-CM | POA: Diagnosis not present

## 2022-07-19 DIAGNOSIS — C44622 Squamous cell carcinoma of skin of right upper limb, including shoulder: Secondary | ICD-10-CM | POA: Diagnosis not present

## 2022-09-12 ENCOUNTER — Other Ambulatory Visit: Payer: Self-pay | Admitting: Acute Care

## 2022-09-12 DIAGNOSIS — Z122 Encounter for screening for malignant neoplasm of respiratory organs: Secondary | ICD-10-CM

## 2022-09-12 DIAGNOSIS — Z87891 Personal history of nicotine dependence: Secondary | ICD-10-CM

## 2022-09-12 DIAGNOSIS — F17210 Nicotine dependence, cigarettes, uncomplicated: Secondary | ICD-10-CM

## 2022-10-12 ENCOUNTER — Ambulatory Visit
Admission: RE | Admit: 2022-10-12 | Discharge: 2022-10-12 | Disposition: A | Discharge status: Home / Self Care | Payer: BC Managed Care – PPO | Source: Ambulatory Visit | Attending: Acute Care | Admitting: Acute Care

## 2022-10-12 DIAGNOSIS — Z87891 Personal history of nicotine dependence: Secondary | ICD-10-CM | POA: Diagnosis not present

## 2022-10-12 DIAGNOSIS — F17210 Nicotine dependence, cigarettes, uncomplicated: Secondary | ICD-10-CM

## 2022-10-12 DIAGNOSIS — Z122 Encounter for screening for malignant neoplasm of respiratory organs: Secondary | ICD-10-CM

## 2022-10-17 ENCOUNTER — Other Ambulatory Visit: Payer: Self-pay

## 2022-10-17 DIAGNOSIS — Z87891 Personal history of nicotine dependence: Secondary | ICD-10-CM

## 2022-10-17 DIAGNOSIS — F17210 Nicotine dependence, cigarettes, uncomplicated: Secondary | ICD-10-CM

## 2022-10-17 DIAGNOSIS — Z122 Encounter for screening for malignant neoplasm of respiratory organs: Secondary | ICD-10-CM

## 2023-03-26 DIAGNOSIS — K5730 Diverticulosis of large intestine without perforation or abscess without bleeding: Secondary | ICD-10-CM | POA: Diagnosis not present

## 2023-03-26 DIAGNOSIS — K648 Other hemorrhoids: Secondary | ICD-10-CM | POA: Diagnosis not present

## 2023-03-26 DIAGNOSIS — Z1211 Encounter for screening for malignant neoplasm of colon: Secondary | ICD-10-CM | POA: Diagnosis not present

## 2023-06-01 DIAGNOSIS — Z125 Encounter for screening for malignant neoplasm of prostate: Secondary | ICD-10-CM | POA: Diagnosis not present

## 2023-06-01 DIAGNOSIS — Z0000 Encounter for general adult medical examination without abnormal findings: Secondary | ICD-10-CM | POA: Diagnosis not present

## 2023-06-06 DIAGNOSIS — I2510 Atherosclerotic heart disease of native coronary artery without angina pectoris: Secondary | ICD-10-CM | POA: Diagnosis not present

## 2023-06-06 DIAGNOSIS — Z8739 Personal history of other diseases of the musculoskeletal system and connective tissue: Secondary | ICD-10-CM | POA: Diagnosis not present

## 2023-06-06 DIAGNOSIS — B351 Tinea unguium: Secondary | ICD-10-CM | POA: Diagnosis not present

## 2023-06-06 DIAGNOSIS — K219 Gastro-esophageal reflux disease without esophagitis: Secondary | ICD-10-CM | POA: Diagnosis not present

## 2023-06-06 DIAGNOSIS — R0683 Snoring: Secondary | ICD-10-CM | POA: Diagnosis not present

## 2023-06-06 DIAGNOSIS — Z0000 Encounter for general adult medical examination without abnormal findings: Secondary | ICD-10-CM | POA: Diagnosis not present

## 2023-06-06 DIAGNOSIS — J309 Allergic rhinitis, unspecified: Secondary | ICD-10-CM | POA: Diagnosis not present

## 2023-07-19 DIAGNOSIS — Z85828 Personal history of other malignant neoplasm of skin: Secondary | ICD-10-CM | POA: Diagnosis not present

## 2023-07-19 DIAGNOSIS — L570 Actinic keratosis: Secondary | ICD-10-CM | POA: Diagnosis not present

## 2023-07-19 DIAGNOSIS — L821 Other seborrheic keratosis: Secondary | ICD-10-CM | POA: Diagnosis not present

## 2023-07-19 DIAGNOSIS — L905 Scar conditions and fibrosis of skin: Secondary | ICD-10-CM | POA: Diagnosis not present

## 2023-09-17 ENCOUNTER — Telehealth: Payer: Self-pay | Admitting: Acute Care

## 2023-09-17 NOTE — Telephone Encounter (Signed)
Returned call to patient to schedule annual LDCT>  no answer. Left VM ?

## 2023-10-15 ENCOUNTER — Ambulatory Visit
Admission: RE | Admit: 2023-10-15 | Discharge: 2023-10-15 | Disposition: A | Discharge status: Home / Self Care | Source: Ambulatory Visit | Attending: Acute Care | Admitting: Acute Care

## 2023-10-15 DIAGNOSIS — F17210 Nicotine dependence, cigarettes, uncomplicated: Secondary | ICD-10-CM | POA: Diagnosis not present

## 2023-10-15 DIAGNOSIS — Z122 Encounter for screening for malignant neoplasm of respiratory organs: Secondary | ICD-10-CM | POA: Diagnosis not present

## 2023-10-15 DIAGNOSIS — Z87891 Personal history of nicotine dependence: Secondary | ICD-10-CM

## 2023-11-01 ENCOUNTER — Other Ambulatory Visit: Payer: Self-pay | Admitting: Acute Care

## 2023-11-01 DIAGNOSIS — Z122 Encounter for screening for malignant neoplasm of respiratory organs: Secondary | ICD-10-CM

## 2023-11-01 DIAGNOSIS — Z87891 Personal history of nicotine dependence: Secondary | ICD-10-CM

## 2023-11-01 DIAGNOSIS — F17210 Nicotine dependence, cigarettes, uncomplicated: Secondary | ICD-10-CM

## 2024-06-13 ENCOUNTER — Encounter: Payer: Self-pay | Admitting: Internal Medicine
